# Patient Record
Sex: Female | Born: 1990
Health system: Southern US, Community
[De-identification: ages and names within clinical notes are randomized; demographics above are authoritative.]

## PROBLEM LIST (undated history)

## (undated) DIAGNOSIS — F410 Panic disorder [episodic paroxysmal anxiety] without agoraphobia: Secondary | ICD-10-CM

## (undated) DIAGNOSIS — F329 Major depressive disorder, single episode, unspecified: Secondary | ICD-10-CM

## (undated) DIAGNOSIS — I10 Essential (primary) hypertension: Secondary | ICD-10-CM

## (undated) DIAGNOSIS — H469 Unspecified optic neuritis: Secondary | ICD-10-CM

## (undated) DIAGNOSIS — G629 Polyneuropathy, unspecified: Secondary | ICD-10-CM

## (undated) DIAGNOSIS — F419 Anxiety disorder, unspecified: Secondary | ICD-10-CM

## (undated) DIAGNOSIS — F429 Obsessive-compulsive disorder, unspecified: Secondary | ICD-10-CM

## (undated) DIAGNOSIS — R51 Headache: Secondary | ICD-10-CM

## (undated) DIAGNOSIS — R519 Headache, unspecified: Secondary | ICD-10-CM

## (undated) DIAGNOSIS — M797 Fibromyalgia: Secondary | ICD-10-CM

## (undated) DIAGNOSIS — F1111 Opioid abuse, in remission: Secondary | ICD-10-CM

## (undated) DIAGNOSIS — F32A Depression, unspecified: Secondary | ICD-10-CM

## (undated) DIAGNOSIS — B977 Papillomavirus as the cause of diseases classified elsewhere: Secondary | ICD-10-CM

## (undated) HISTORY — DX: Anxiety disorder, unspecified: F41.9

## (undated) HISTORY — DX: Unspecified optic neuritis: H46.9

## (undated) HISTORY — PX: WISDOM TOOTH EXTRACTION: SHX21

## (undated) HISTORY — DX: Obsessive-compulsive disorder, unspecified: F42.9

## (undated) HISTORY — DX: Fibromyalgia: M79.7

## (undated) HISTORY — DX: Headache, unspecified: R51.9

## (undated) HISTORY — DX: Polyneuropathy, unspecified: G62.9

## (undated) HISTORY — DX: Panic disorder (episodic paroxysmal anxiety): F41.0

## (undated) HISTORY — DX: Headache: R51

## (undated) HISTORY — DX: Major depressive disorder, single episode, unspecified: F32.9

## (undated) HISTORY — DX: Essential (primary) hypertension: I10

## (undated) HISTORY — DX: Papillomavirus as the cause of diseases classified elsewhere: B97.7

## (undated) HISTORY — DX: Depression, unspecified: F32.A

---

## 2014-07-18 DIAGNOSIS — F419 Anxiety disorder, unspecified: Secondary | ICD-10-CM | POA: Insufficient documentation

## 2014-08-15 ENCOUNTER — Encounter (HOSPITAL_COMMUNITY): Payer: Self-pay | Admitting: Licensed Clinical Social Worker

## 2014-08-15 ENCOUNTER — Encounter (INDEPENDENT_AMBULATORY_CARE_PROVIDER_SITE_OTHER): Payer: Self-pay

## 2014-08-15 ENCOUNTER — Ambulatory Visit (INDEPENDENT_AMBULATORY_CARE_PROVIDER_SITE_OTHER): Payer: BLUE CROSS/BLUE SHIELD | Admitting: Licensed Clinical Social Worker

## 2014-08-15 DIAGNOSIS — F429 Obsessive-compulsive disorder, unspecified: Secondary | ICD-10-CM | POA: Insufficient documentation

## 2014-08-15 DIAGNOSIS — F42 Obsessive-compulsive disorder: Secondary | ICD-10-CM | POA: Diagnosis not present

## 2014-08-15 DIAGNOSIS — R519 Headache, unspecified: Secondary | ICD-10-CM

## 2014-08-15 DIAGNOSIS — R51 Headache: Secondary | ICD-10-CM

## 2014-08-15 NOTE — Progress Notes (Signed)
Patient:   Jaime Leblanc   DOB:   10/26/1990  MR Number:  [id number]  [facility]  [facility] [place] [address] [place] [id number] Dept: [phone]           Date of Service:   08/15/14  Start Time:   10:10am End Time:   11:10am  Provider/Observer:  Marilu FavreSarah A Gaje Tennyson Clinical Social Work       Billing Code/Service: [phone]  Comprehensive Clinical Assessment  Information for assessment provided by: patient   Chief Complaint:    Anxiety     Presenting Problem/Symptoms:  Mom was sick with stage 4 colon cancer through much of Zaiyah's childhood.  She survived.  Often thinks about how she wants to be there for her mom if anything bad were to happen.  Mom no longer has colostomy bag, but does have to use the bathroom pretty frequently.  Went to [employer] for college.  Moved back home March 2013.  Didn't like being away from family.   December her best friend's mom got sick with cancer and died.  It happened quickly.   Anxiety is more noticeable when she goes out of town.      Previous MH/SA diagnoses: none      Mental Health Symptoms:    Depression: Denies depressed mood or anhedonia   Anxiety: Marked restlessness or feeling on edge, easily fatigued, irritability, muscle tension, sleep disturbance  Panic Attacks: estimates 2 in the past two months  Self-Harm Potential: Thoughts of Self-Harm: none Method: no plan Availability of means: na Is there a family history of suicide? no Previous attempts? no Preoccupation with death? Yes, thinks about what would happen if her parents passed away History of acts of self-harm? no  Dangerousness to Others Potential: Denies Family history of violence? no Previous attempts? no    Mania/hypomania: denies    Psychosis: denies    Abuse/Trauma History: denies  PTSD symptoms: Hypervigilance, exaggerated startle response  Obsessions: recurrent &  persistent thoughts/impulses/images that cause anxiety, attempts to suppress or neutralize the thoughts, time-consuming, disrupts routine/functioning, good/poor/no insight  Intrusive thoughts about germs Intrusive thoughts about health of mom  Compulsions: repeated behaviors/mental acts, "driven" to perform behaviors/acts, intended to reduce stress or prevent a negative outcome, not connected to an actual stressor, intrusive/time consuming, disrupts routine/functioning, good/poor/no insight  For the past month she has been taking 4-5 showers a day, washing her hands repeatedly, using hand sanitizer, and brushing and flossing her teeth 5-6 times a day.     Gets anxious when her school work is not planned out perfectly.  Writes down everything even though the likelihood of her forgetting is low.            Mental Status  Interactions:    Active   Attention:   Good  Memory:   Intact  Speech:   pressured  Flow of Thought:  Normal  Thought Content:  Rumination  Orientation:   person, place and time/date  Judgment:   Good  Affect/Mood:   Anxious  Insight:   Good        Medical History:    Headaches Stomach aches  Current medications:   B12 1cc daily Celexa 10mg  daily Buspirone 10mg  Take 5mg  3 times daily as needed Zofran as needed for nausea                  Mental Health/Substance Use Treatment History:    none  Family Med/Psych History: No MH issues in family                                                Mom- colon cancer    Substance Use History:   Former smoker- 2 years Alcohol-maybe once a month   Marital Status: single  Lives with: mom, dad, brother ([phone])  Family Relationships: mom is "the most important person" in her life Good relationship with dad- similar personality Polar opposites of her brother-cares a lot about getting approval from others, studying law  My maternal grandmother watched me while my mom was  sick.  Spends time with grandparents on the weekend.   Other Social Supports: 3 close friends  Current Employment: Psychologist, occupational in the mall for the past 6 months   Enjoys it  Past Employment:  Social worker   Education:  some Automotive engineer        Currently going to [employer] to double major in Chief Financial Officer and History                                        Going to [employer] in the fall.  Legal History:  none  Religion/Spirituality:  Christian, Catholic  Not currently going to church  Hobbies:  Drawing, spend time with animals, volunteers at [employer], spend time with grandparents  Strengths/Protective Factors: a positive person, caring, never holds a grudge        Impression/DX:  F42  Obsessive Compulsive Disorder  Disposition/Plan:  Recommending individual therapy with a focus on CBT interventions for OCD.  Also recommending a psychiatric evaluation followed by medication management.

## 2014-08-30 ENCOUNTER — Emergency Department
Admission: EM | Admit: 2014-08-30 | Discharge: 2014-08-30 | Disposition: A | Discharge status: Home / Self Care | Payer: BLUE CROSS/BLUE SHIELD | Source: Home / Self Care | Attending: Family Medicine | Admitting: Family Medicine

## 2014-08-30 ENCOUNTER — Encounter: Payer: Self-pay | Admitting: Emergency Medicine

## 2014-08-30 DIAGNOSIS — R590 Localized enlarged lymph nodes: Secondary | ICD-10-CM

## 2014-08-30 LAB — POCT CBC W AUTO DIFF (K'VILLE URGENT CARE)

## 2014-08-30 NOTE — ED Notes (Signed)
Patient reports noticing a lump under right arm/axilla last night; it is tender.

## 2014-08-30 NOTE — Discharge Instructions (Signed)
May take Ibuprofen , 3 tabs every 8 hours with food as needed for pain.   Lymphadenopathy Lymphadenopathy means "disease of the lymph glands." But the term is usually used to describe swollen or enlarged lymph glands, also called lymph nodes. These are the bean-shaped organs found in many locations including the neck, underarm, and groin. Lymph glands are part of the immune system, which fights infections in your body. Lymphadenopathy can occur in just one area of the body, such as the neck, or it can be generalized, with lymph node enlargement in several areas. The nodes found in the neck are the most common sites of lymphadenopathy. CAUSES When your immune system responds to germs (such as viruses or bacteria ), infection-fighting cells and fluid build up. This causes the glands to grow in size. Usually, this is not something to worry about. Sometimes, the glands themselves can become infected and inflamed. This is called lymphadenitis. Enlarged lymph nodes can be caused by many diseases:  Bacterial disease, such as strep throat or a skin infection.  Viral disease, such as a common cold.  Other germs, such as Lyme disease, tuberculosis, or sexually transmitted diseases.  Cancers, such as lymphoma (cancer of the lymphatic system) or leukemia (cancer of the white blood cells).  Inflammatory diseases such as lupus or rheumatoid arthritis.  Reactions to medications. Many of the diseases above are rare, but important. This is why you should see your caregiver if you have lymphadenopathy. SYMPTOMS  Swollen, enlarged lumps in the neck, back of the head, or other locations.  Tenderness.  Warmth or redness of the skin over the lymph nodes.  Fever. DIAGNOSIS Enlarged lymph nodes are often near the source of infection. They can help health care providers diagnose your illness. For instance:  Swollen lymph nodes around the jaw might be caused by an infection in the mouth.  Enlarged  glands in the neck often signal a throat infection.  Lymph nodes that are swollen in more than one area often indicate an illness caused by a virus. Your caregiver will likely know what is causing your lymphadenopathy after listening to your history and examining you. Blood tests, x-rays, or other tests may be needed. If the cause of the enlarged lymph node cannot be found, and it does not go away by itself, then a biopsy may be needed. Your caregiver will discuss this with you. TREATMENT Treatment for your enlarged lymph nodes will depend on the cause. Many times the nodes will shrink to normal size by themselves, with no treatment. Antibiotics or other medicines may be needed for infection. Only take over-the-counter or prescription medicines for pain, discomfort, or fever as directed by your caregiver. HOME CARE INSTRUCTIONS Swollen lymph glands usually return to normal when the underlying medical condition goes away. If they persist, contact your health-care provider. He/she might prescribe antibiotics or other treatments, depending on the diagnosis. Take any medications exactly as prescribed. Keep any follow-up appointments made to check on the condition of your enlarged nodes. SEEK MEDICAL CARE IF:  Swelling lasts for more than two weeks.  You have symptoms such as weight loss, night sweats, fatigue, or fever that does not go away.  The lymph nodes are hard, seem fixed to the skin, or are growing rapidly.  Skin over the lymph nodes is red and inflamed. This could mean there is an infection. SEEK IMMEDIATE MEDICAL CARE IF:  Fluid starts leaking from the area of the enlarged lymph node.  You develop a fever of  102 F (38.9 C) or greater.  Severe pain develops (not necessarily at the site of a large lymph node).  You develop chest pain or shortness of breath.  You develop worsening abdominal pain. MAKE SURE YOU:  Understand these instructions.  Will watch your condition.  Will  get help right away if you are not doing well or get worse. Document Released: 02/26/2008 Document Revised: 10/03/2013 Document Reviewed: 02/26/2008 [facility] Patient Information 2015 [place], [place]. This information is not intended to replace advice given to you by your health care provider. Make sure you discuss any questions you have with your health care provider.

## 2014-08-30 NOTE — ED Provider Notes (Signed)
CSN: [id number]     Arrival date & time 08/30/14  1159 History   First MD Initiated Contact with Patient 08/30/14 1302     Chief Complaint  Patient presents with  . Mass      HPI Comments: Patient reports feeling a tender lump in her right axilla last night.  She feels well otherwise.  No fevers, chills, and sweats.  She denies animal or cat bites. She reports having a viral illness 1.5 weeks ago with fever, mouth sores, and nausea/vomiting, all resolved.  The history is provided by the patient and a parent.    Past Medical History  Diagnosis Date  . Headache   . Anxiety    Past Surgical History  Procedure Laterality Date  . Wisdom tooth extraction     Family History  Problem Relation Age of Onset  . Colon cancer Mother    History  Substance Use Topics  . Smoking status: Former Smoker    Quit date: 08/15/2010  . Smokeless tobacco: Not on file  . Alcohol Use: Yes     Comment: Maybe once a month   OB History    No data available     Review of Systems  Constitutional: Negative for fever, chills, diaphoresis, activity change, appetite change and fatigue.  HENT: Negative.   Eyes: Negative.   Respiratory: Negative.   Cardiovascular: Negative.   Gastrointestinal: Negative.   Genitourinary: Negative.   Musculoskeletal: Negative.   Skin: Negative.   Neurological: Negative.   Hematological: Positive for adenopathy.    Allergies  Azithromycin and Lamotrigine  Home Medications   Prior to Admission medications   Medication Sig Start Date End Date Taking? Authorizing Provider  citalopram (CELEXA) 10 MG tablet Take 10 mg by mouth. 06/29/14 06/29/15  Historical Provider, MD  cyanocobalamin (,VITAMIN B-12,) 1000 MCG/ML injection  04/05/14   Historical Provider, MD  diphenhydrAMINE (BENADRYL) 25 mg capsule Take 25 mg by mouth.    Historical Provider, MD  etonogestrel (NEXPLANON) 68 MG IMPL implant Inject into the skin.    Historical Provider, MD   There were no vitals taken  for this visit. Physical Exam Nursing notes and Vital Signs reviewed. Appearance:  Patient appears stated age, and in no acute distress Eyes:  Pupils are equal, round, and reactive to light and accomodation.  Extraocular movement is intact.  Conjunctivae are not inflamed  Ears:  Canals normal.  Tympanic membranes normal.  Nose:   Normal turbinates.  No sinus tenderness.   Pharynx:  Normal Neck:  Supple.   Tender shotty posterior nodes are palpated bilaterally  Right axilla:  Prominent soft tender node approximately 2.5cm by 5cm.  Two smaller nodes tender but not enlarged.  Left axilla:  Several prominent nontender nodes. Lungs:  Clear to auscultation.  Breath sounds are equal.  Heart:  Regular rate and rhythm without murmurs, rubs, or gallops.  Abdomen:  Nontender without masses or hepatosplenomegaly.  There is tenderness to palpation over spleen.  Bowel sounds are present.  No CVA or flank tenderness.  Extremities:  No edema.  No calf tenderness Skin:  No rash present.  No lesions or evidence cellulitis right upper extremity ED Course  Procedures  None    Labs Reviewed  EPSTEIN-BARR VIRUS VCA, IGG  EPSTEIN-BARR VIRUS VCA, IGM  EPSTEIN-BARR VIRUS EARLY D ANTIGEN ANTIBODY, IGG  EPSTEIN-BARR VIRUS NUCLEAR ANTIGEN ANTIBODY, IGG  POCT CBC W AUTO DIFF ([facility]):  WBC 6.9; LY 32.4; MO 4.2; GR 63.4; Hgb 13.7; Platelets  276       MDM   1. Lymphadenopathy, axillary    EBV titers pending.   Followup with Family Doctor if not improved in two weeks.    Lattie Haw, MD 09/02/14 [phone]

## 2014-08-31 LAB — EPSTEIN-BARR VIRUS NUCLEAR ANTIGEN ANTIBODY, IGG: EBV NA IgG: 219 U/mL — ABNORMAL HIGH (ref <18.0)

## 2014-08-31 LAB — EPSTEIN-BARR VIRUS EARLY D ANTIGEN ANTIBODY, IGG: EBV EA IgG: <5 U/mL (ref <9.0)

## 2014-08-31 LAB — EPSTEIN-BARR VIRUS VCA, IGM

## 2014-08-31 LAB — EPSTEIN-BARR VIRUS VCA, IGG: EBV VCA IgG: 422 U/mL — ABNORMAL HIGH (ref <18.0)

## 2014-09-04 ENCOUNTER — Telehealth: Payer: Self-pay | Admitting: Emergency Medicine

## 2014-09-04 ENCOUNTER — Ambulatory Visit (HOSPITAL_COMMUNITY): Payer: BLUE CROSS/BLUE SHIELD | Admitting: Licensed Clinical Social Worker

## 2014-09-12 ENCOUNTER — Ambulatory Visit (HOSPITAL_COMMUNITY): Payer: BLUE CROSS/BLUE SHIELD | Admitting: Psychiatry

## 2014-10-12 ENCOUNTER — Ambulatory Visit (HOSPITAL_COMMUNITY): Payer: BLUE CROSS/BLUE SHIELD | Admitting: Licensed Clinical Social Worker

## 2014-10-19 ENCOUNTER — Ambulatory Visit (HOSPITAL_COMMUNITY): Payer: BLUE CROSS/BLUE SHIELD | Admitting: Licensed Clinical Social Worker

## 2014-10-26 ENCOUNTER — Ambulatory Visit (HOSPITAL_COMMUNITY): Payer: BLUE CROSS/BLUE SHIELD | Admitting: Licensed Clinical Social Worker

## 2014-11-02 ENCOUNTER — Ambulatory Visit (HOSPITAL_COMMUNITY): Payer: BLUE CROSS/BLUE SHIELD | Admitting: Licensed Clinical Social Worker

## 2014-11-09 ENCOUNTER — Ambulatory Visit (HOSPITAL_COMMUNITY): Payer: BLUE CROSS/BLUE SHIELD | Admitting: Licensed Clinical Social Worker

## 2014-11-16 ENCOUNTER — Ambulatory Visit (HOSPITAL_COMMUNITY): Payer: BLUE CROSS/BLUE SHIELD | Admitting: Licensed Clinical Social Worker

## 2014-11-23 ENCOUNTER — Ambulatory Visit (HOSPITAL_COMMUNITY): Payer: BLUE CROSS/BLUE SHIELD | Admitting: Licensed Clinical Social Worker

## 2014-11-30 ENCOUNTER — Ambulatory Visit (HOSPITAL_COMMUNITY): Payer: BLUE CROSS/BLUE SHIELD | Admitting: Licensed Clinical Social Worker

## 2014-12-27 ENCOUNTER — Encounter: Payer: Self-pay | Admitting: *Deleted

## 2014-12-27 ENCOUNTER — Emergency Department (INDEPENDENT_AMBULATORY_CARE_PROVIDER_SITE_OTHER)
Admission: EM | Admit: 2014-12-27 | Discharge: 2014-12-27 | Disposition: A | Discharge status: Home / Self Care | Payer: BLUE CROSS/BLUE SHIELD | Source: Home / Self Care | Attending: Family Medicine | Admitting: Family Medicine

## 2014-12-27 DIAGNOSIS — B002 Herpesviral gingivostomatitis and pharyngotonsillitis: Secondary | ICD-10-CM

## 2014-12-27 DIAGNOSIS — J029 Acute pharyngitis, unspecified: Secondary | ICD-10-CM | POA: Diagnosis not present

## 2014-12-27 LAB — POCT URINE PREGNANCY: PREG TEST UR: NEGATIVE

## 2014-12-27 LAB — POCT RAPID STREP A (OFFICE): Rapid Strep A Screen: NEGATIVE

## 2014-12-27 MED ORDER — VALACYCLOVIR HCL 1 G PO TABS: ORAL_TABLET | ORAL | Status: DC

## 2014-12-27 NOTE — ED Notes (Signed)
Pt c/o sore on her RT side bottom lip x 1 day with chills and increased heart rate. She also request a pregnancy test.

## 2014-12-27 NOTE — ED Provider Notes (Signed)
CSN: [id number]     Arrival date & time 12/27/14  1317 History   First MD Initiated Contact with Patient 12/27/14 1335     Chief Complaint  Patient presents with  . Oral Swelling      HPI Comments: Patient complains of onset of sore swollen area on right lower lip yesterday.  She has had cold sores in the past but this lesion seems different.  She has had chills/sweats, nausea, myalgias, and mild headache.  She developed a sore throat last night, and soreness in her anterior chest.  She has been fatigued for about 1.5 weeks. She requests a pregnancy test also.  The history is provided by the patient.    Past Medical History  Diagnosis Date  . Headache   . Anxiety    Past Surgical History  Procedure Laterality Date  . Wisdom tooth extraction     Family History  Problem Relation Age of Onset  . Colon cancer Mother   . Cancer Mother   . Hypertension Mother   . Hypertension Father    History  Substance Use Topics  . Smoking status: Former Smoker    Quit date: 08/15/2010  . Smokeless tobacco: Not on file  . Alcohol Use: Yes     Comment: Maybe once a month   OB History    No data available     Review of Systems + sore throat No cough No pleuritic pain No wheezing + nasal congestion ? post-nasal drainage No sinus pain/pressure No itchy/red eyes No earache No hemoptysis No SOB No fever, + chills + nausea No vomiting + abdominal pain + diarrhea No urinary symptoms No skin rash + fatigue No myalgias + headache    Allergies  Azithromycin and Lamotrigine  Home Medications   Prior to Admission medications   Medication Sig Start Date End Date Taking? Authorizing Provider  DULoxetine (CYMBALTA) 30 MG capsule Take 30 mg by mouth daily.   Yes Historical Provider, MD  citalopram (CELEXA) 10 MG tablet Take 10 mg by mouth. 06/29/14 06/29/15  Historical Provider, MD  cyanocobalamin (,VITAMIN B-12,) 1000 MCG/ML injection  04/05/14   Historical Provider, MD    diphenhydrAMINE (BENADRYL) 25 mg capsule Take 25 mg by mouth.    Historical Provider, MD  etonogestrel (NEXPLANON) 68 MG IMPL implant Inject into the skin.    Historical Provider, MD  valACYclovir (VALTREX) 1000 MG tablet Take two tabs by mouth every 12 hours for one day.  Take at onset of cold sore 12/27/14   Lattie Haw, MD   BP 129/86 mmHg  Pulse 117  Temp(Src) 99.2 F (37.3 C) (Oral)  Resp 16  Ht  (1.651 m)  Wt 127 lb (57.607 kg)  BMI 21.13 kg/m2  SpO2 98% Physical Exam  Constitutional: She is oriented to person, place, and time. She appears well-developed and well-nourished. No distress.  HENT:  Head: Normocephalic.  Right Ear: Tympanic membrane and external ear normal.  Left Ear: Tympanic membrane and external ear normal.  Mouth/Throat: Oropharynx is clear and moist and mucous membranes are normal.    Right lower external lip has a vesicular eruption with tenderness to palpation and mild swelling as noted on diagram.    Eyes: Conjunctivae and EOM are normal. Pupils are equal, round, and reactive to light. Right eye exhibits no discharge. Left eye exhibits no discharge.  Neck: Neck supple.  Tender shotty anterior and posterior nodes are palpated.  Cardiovascular: Normal heart sounds.   Pulmonary/Chest: Breath sounds  normal.  Abdominal: There is no tenderness.  Lymphadenopathy:    She has cervical adenopathy.  Neurological: She is alert and oriented to person, place, and time.  Skin: Skin is warm and dry.  Nursing note and vitals reviewed.   ED Course  Procedures  None    Labs Reviewed  POCT RAPID STREP A (OFFICE) negative  POCT URINE PREGNANCY negative      MDM   1. Herpes stomatitis; suspect early viral URI    Begin Valtrex If cold-like symptoms develop, try the following:  Take plain guaifenesin (1200mg  extended release tabs such as Mucinex) twice daily, with plenty of water, for cough and congestion.  May add Pseudoephedrine (30mg , one or two  every 4 to 6 hours) for sinus congestion.  Get adequate rest.   May use Afrin nasal spray (or generic oxymetazoline) twice daily for about 5 days.  Also recommend using saline nasal spray several times daily and saline nasal irrigation (AYR is a common brand).  Use Flonase nasal spray each morning after using Afrin nasal spray and saline nasal irrigation. Try warm salt water gargles for sore throat.  Stop all antihistamines for now, and other non-prescription cough/cold preparations. May take Ibuprofen 200mg , 4 tabs every 8 hours with food for body aches, fever, etc. May take Delsym Cough Suppressant at bedtime for nighttime cough.    Follow-up with family doctor if not improving about 7 to10 days.     Lattie Haw, MD 12/31/14 1003

## 2014-12-27 NOTE — Discharge Instructions (Signed)
If cold-like symptoms develop, try the following:  Take plain guaifenesin (  extended release tabs such as Mucinex) twice daily, with plenty of water, for cough and congestion.  May add Pseudoephedrine ( , one or two every 4 to 6 hours) for sinus congestion.  Get adequate rest.   May use Afrin nasal spray (or generic oxymetazoline) twice daily for about 5 days.  Also recommend using saline nasal spray several times daily and saline nasal irrigation (AYR is a common brand).  Use Flonase nasal spray each morning after using Afrin nasal spray and saline nasal irrigation. Try warm salt water gargles for sore throat.  Stop all antihistamines for now, and other non-prescription cough/cold preparations. May take Ibuprofen , 4 tabs every 8 hours with food for body aches, fever, etc. May take Delsym Cough Suppressant at bedtime for nighttime cough.    Follow-up with family doctor if not improving about 7 to10 days.      Primary Herpetic Gingivostomatitis  Primary herpetic gingivostomatitis is an infection of the mouth, gums, and throat. It is a common infection in children, teenagers, and young adults. CAUSES  Primary herpetic gingivostomatitis is caused by a virus called herpes simplex type 1 (HSV). This is the same virus that causes cold sores. This virus is carried by many people. Most people get this infection early in childhood. Once infected, people carry the virus forever. It may flare up as cold sores repeatedly. The first infection of this virus may go unnoticed. When it causes symptoms of sore mouth and gums, it is called gingivostomatitis. SYMPTOMS  The symptoms of this infection can be mild or severe. Symptoms may last for 1 to 2 weeks and may include:  Small sores and blisters in the mouth, tongue, gums, throat, and on the lips.  Swelling of the gums.  Severe mouth pain.  Bleeding gums.  Irritability from pain.  Decreased appetite or refusal to eat or  drink.  Drooling.  Bad breath.  High fever.  Swollen tender lymph nodes on the sides of the neck.  Headache.  General discomfort, uneasiness, or ill feeling. DIAGNOSIS  Diagnosis of gingivostomatitis is usually made by a physical exam. Sometimes the sores are tested for the HSV virus. TREATMENT  This infection goes away on its own. Sometimes, a medicine to treat the herpes virus is used to help shorten the illness. Medicated mouth rinses can help with mouth pain. HOME CARE INSTRUCTIONS  Only take over-the-counter or prescription medicines for pain, discomfort, or fever as directed by your caregiver.  Keep the mouth and teeth clean. Use gentle brushing. If brushing is too painful, wipe the teeth with a wet washcloth. Bleeding of the gums may occur.  Infants should continue with breast milk or formula as normal.  Offer soft and cold foods to toddlers and children. Ice cream, gelatin dessert, and yogurt work well.  Offer plenty of liquids to prevent dehydration. Frozen ice pops and cool, non-citrus juices may be soothing.  Keep your child away from others, especially infants and patients on cancer medicines.  Wash your hands well after handling children that are infected.  Infected children should keep their hands away from their mouth. They should avoid rubbing their eyes, and they should wash their hands often. SEEK MEDICAL CARE IF:   Your child is refusing to drink or take fluids.  Your child's fever comes back after being gone for 1 or 2 days.  Your child's pain is severe and is not controlled with medicines.  Your child's  condition is getting worse. SEEK IMMEDIATE MEDICAL CARE IF:   Your child has pain and redness in the eye.  Your child has decreased or blurred vision.  Your child has eye pain or increased sensitivity to light.  Your child has tearing or fluid draining from the eye.  Your child has signs of dehydration such as unusual fussiness, weakness,  fatigue, dry mouth, no tears when crying, or not urinating at least once every 8 hours. MAKE SURE YOU:  Understand these instructions.  Will watch your child's condition.  Will get help right away if your child is not doing well or gets worse. Document Released: 08/26/2007 Document Revised: 08/11/2011 Document Reviewed: 12/09/2010 [facility] Patient Information 2015 [place], [place]. This information is not intended to replace advice given to you by your health care provider. Make sure you discuss any questions you have with your health care provider.

## 2014-12-28 LAB — STREP A DNA PROBE: GASP: NEGATIVE

## 2015-02-14 DIAGNOSIS — M797 Fibromyalgia: Secondary | ICD-10-CM | POA: Insufficient documentation

## 2015-07-06 ENCOUNTER — Telehealth: Payer: Self-pay | Admitting: Family Medicine

## 2015-07-06 NOTE — Telephone Encounter (Signed)
Patient scheduled for June 2nd at 3:3pm

## 2015-07-06 NOTE — Telephone Encounter (Addendum)
Please advise      KP 

## 2015-07-06 NOTE — Telephone Encounter (Signed)
Ok to establish 

## 2015-07-06 NOTE — Telephone Encounter (Signed)
Caller Name: Lienhard,Tina Relation to [id number]  Call back [phone]   Reason for call:  Patient would like to establish care with you she was referred by [place],Tammy L. Please advise

## 2015-11-02 ENCOUNTER — Ambulatory Visit: Payer: Self-pay | Admitting: Family Medicine

## 2015-12-19 ENCOUNTER — Telehealth: Payer: Self-pay | Admitting: Family Medicine

## 2015-12-19 NOTE — Telephone Encounter (Signed)
Attempted to contact patient to reschedule appointment with Dr. Zola ButtonLowne-Chase on August 31. Left message for patient to call and reschedule appointment. Appointment cancelled

## 2016-01-07 ENCOUNTER — Telehealth: Payer: Self-pay | Admitting: Behavioral Health

## 2016-01-07 ENCOUNTER — Encounter: Payer: Self-pay | Admitting: Behavioral Health

## 2016-01-07 ENCOUNTER — Encounter: Payer: Self-pay | Admitting: Family Medicine

## 2016-01-07 NOTE — Telephone Encounter (Signed)
Opened in error

## 2016-01-07 NOTE — Telephone Encounter (Signed)
Pre-Visit Call completed with patient and chart updated.   Pre-Visit Info documented in Specialty Comments under [employer].    

## 2016-01-08 ENCOUNTER — Ambulatory Visit (INDEPENDENT_AMBULATORY_CARE_PROVIDER_SITE_OTHER): Payer: BLUE CROSS/BLUE SHIELD | Admitting: Family Medicine

## 2016-01-08 ENCOUNTER — Other Ambulatory Visit: Payer: Self-pay

## 2016-01-08 ENCOUNTER — Encounter: Payer: Self-pay | Admitting: Family Medicine

## 2016-01-08 VITALS — BP 98/70 | HR 83 | Temp 98.2°F | Ht 64.75 in | Wt 135.0 lb

## 2016-01-08 DIAGNOSIS — G629 Polyneuropathy, unspecified: Secondary | ICD-10-CM | POA: Diagnosis not present

## 2016-01-08 DIAGNOSIS — G43001 Migraine without aura, not intractable, with status migrainosus: Secondary | ICD-10-CM | POA: Diagnosis not present

## 2016-01-08 DIAGNOSIS — M797 Fibromyalgia: Secondary | ICD-10-CM

## 2016-01-08 DIAGNOSIS — B370 Candidal stomatitis: Secondary | ICD-10-CM | POA: Diagnosis not present

## 2016-01-08 MED ORDER — ELETRIPTAN HYDROBROMIDE 20 MG PO TABS: 20.0000 mg | ORAL_TABLET | ORAL | 0 refills | Status: DC | PRN

## 2016-01-08 MED ORDER — KETOROLAC TROMETHAMINE 60 MG/2ML IM SOLN
60.0000 mg | Freq: Once | INTRAMUSCULAR | Status: AC
  Administered 2016-01-08: 60 mg via INTRAMUSCULAR

## 2016-01-08 NOTE — Patient Instructions (Signed)

## 2016-01-08 NOTE — Progress Notes (Unsigned)
Pre visit review using our clinic review tool, if applicable. No additional management support is needed unless otherwise documented below in the visit note. 

## 2016-01-08 NOTE — Progress Notes (Signed)
Patient ID: Jaime Leblanc, female    DOB: 10/16/1990  Age: 25 y.o. MRN: [id number]    Subjective:  Subjective  HPI Jaime Leblanc presents for mult complaints and to establish.  Pt has had several episodes of thrush, viruses, rashes,  Foot and mouth.  She has been tested for lyme, hiv and has seen several specialists-- including neuro, rheum and ID .     Review of Systems  Constitutional: Positive for fatigue. Negative for activity change, appetite change and unexpected weight change.  HENT: Positive for mouth sores.   Respiratory: Negative for cough and shortness of breath.   Cardiovascular: Negative for chest pain and palpitations.  Musculoskeletal: Positive for arthralgias, back pain, myalgias and neck pain.  Neurological: Positive for seizures and headaches.  Psychiatric/Behavioral: Positive for dysphoric mood. Negative for behavioral problems. The patient is not nervous/anxious.     History Past Medical History:  Diagnosis Date  . Anxiety   . Depression   . Fibromyalgia   . Headache   . HPV in female   . Neuropathy (HCC)    Left hand and in muscle all over the body  . OCD (obsessive compulsive disorder)   . Optic neuritis    Left Eye  . Panic disorder     She has a past surgical history that includes Wisdom tooth extraction.   Her family history includes Anxiety disorder in her mother; Colon cancer in her mother; Depression in her mother; Gout in her father; Hypertension in her father and mother.She reports that she quit smoking about 5 years ago. She does not have any smokeless tobacco history on file. She reports that she drinks alcohol. She reports that she does not use drugs.  Current Outpatient Prescriptions on File Prior to Visit  Medication Sig Dispense Refill  . ALPRAZolam (XANAX) 1 MG tablet Take 1 mg by mouth 3 (three) times daily.    . cyanocobalamin (,VITAMIN B-12,) 1000 MCG/ML injection     . diphenhydrAMINE (BENADRYL) 25 mg capsule Take 25 mg by mouth as  needed.     . etonogestrel (NEXPLANON) 68 MG IMPL implant Inject into the skin.    Marland Kitchen oxyCODONE-acetaminophen (PERCOCET) 7.5-325 MG tablet Take 1 tablet by mouth every 6 (six) hours as needed for severe pain.    Marland Kitchen sertraline (ZOLOFT) 100 MG tablet Take 200 mg by mouth daily.    . valACYclovir (VALTREX) 1000 MG tablet Take two tabs by mouth every 12 hours for one day.  Take at onset of cold sore (Patient not taking: Reported on 01/07/2016) 4 tablet 3   No current facility-administered medications on file prior to visit.      Objective:  Objective  Physical Exam  Constitutional: She is oriented to person, place, and time. She appears well-developed and well-nourished.  HENT:  Head: Normocephalic and atraumatic.  Eyes: Conjunctivae and EOM are normal.  Neck: Normal range of motion. Neck supple. No JVD present. Carotid bruit is not present. No thyromegaly present.  Cardiovascular: Normal rate, regular rhythm and normal heart sounds.   No murmur heard. Pulmonary/Chest: Effort normal and breath sounds normal. No respiratory distress. She has no wheezes. She has no rales. She exhibits no tenderness.  Musculoskeletal: She exhibits no edema.  Neurological: She is alert and oriented to person, place, and time.  Psychiatric: She has a normal mood and affect. Her behavior is normal. Judgment and thought content normal.  Nursing note and vitals reviewed.  BP 98/70 (BP Location: Right Arm, Patient Position: Sitting, Cuff  Size: Small)   Pulse 83   Temp 98.2 F (36.8 C) (Oral)   Ht 5' 4.75" (1.645 m)   Wt 135 lb (61.2 kg)   LMP 01/08/2016 (Exact Date)   SpO2 98%   BMI 22.64 kg/m  Wt Readings from Last 3 Encounters:  01/08/16 135 lb (61.2 kg)  12/27/14 127 lb (57.6 kg)  08/30/14 124 lb (56.2 kg)     No results found for: WBC, HGB, HCT, PLT, GLUCOSE, CHOL, TRIG, HDL, LDLDIRECT, LDLCALC, ALT, AST, NA, K, CL, CREATININE, BUN, CO2, TSH, PSA, INR, GLUF, HGBA1C, MICROALBUR  No results found.     Assessment & Plan:  Plan  I am having Jaime Leblanc start on eletriptan. I am also having her maintain her cyanocobalamin, diphenhydrAMINE, etonogestrel, valACYclovir, oxyCODONE-acetaminophen, ALPRAZolam, sertraline, ketoconazole, and traZODone. We administered ketorolac.  Meds ordered this encounter  Medications  . eletriptan (RELPAX) 20 MG tablet    Sig: Take 1 tablet (20 mg total) by mouth as needed for migraine or headache. May repeat in 2 hours if headache persists or recurs.    Dispense:  10 tablet    Refill:  0  . ketorolac (TORADOL) injection 60 mg    Problem List Items Addressed This Visit      Unprioritized   Fibromyalgia    Per rheumatology--- switching to [facility] Takes pain meds and is also going into pain management      Relevant Medications   ketorolac (TORADOL) injection 60 mg (Completed)   Neuropathy (HCC)    Unknown etiology Being worked up by [facility]  [facility] pending       Thrush, oral    Recurrent [facility] magic mouthwash and diflucan not effective        Other Visit Diagnoses    Migraine without aura and with status migrainosus, not intractable    -  Primary   Relevant Medications   eletriptan (RELPAX) 20 MG tablet   ketorolac (TORADOL) injection 60 mg (Completed)    medical records from [facility] reviewed in care every where.   Follow-up: Return if symptoms worsen or fail to improve, for annual exam, fasting.  Donato SchultzYvonne R Lowne Chase, DO

## 2016-01-08 NOTE — Progress Notes (Signed)
Pre visit review using our clinic review tool, if applicable. No additional management support is needed unless otherwise documented below in the visit note. 

## 2016-01-09 ENCOUNTER — Encounter: Payer: Self-pay | Admitting: Family Medicine

## 2016-01-09 DIAGNOSIS — G629 Polyneuropathy, unspecified: Secondary | ICD-10-CM | POA: Insufficient documentation

## 2016-01-09 DIAGNOSIS — B370 Candidal stomatitis: Secondary | ICD-10-CM | POA: Insufficient documentation

## 2016-01-09 NOTE — Assessment & Plan Note (Signed)
Unknown etiology [facility]  [facility] pending

## 2016-01-09 NOTE — Assessment & Plan Note (Signed)
Per rheumatology--- switching to [facility] Takes pain meds and is also going into pain management

## 2016-01-09 NOTE — Assessment & Plan Note (Signed)
Recurrent [facility] magic mouthwash and diflucan not effective

## 2016-01-16 ENCOUNTER — Telehealth: Payer: Self-pay | Admitting: Family Medicine

## 2016-01-16 NOTE — Telephone Encounter (Signed)
°  Relationship to patient: Self Can be reached: [phone]   Reason for call: Request refill on oxyCODONE-acetaminophen (PERCOCET) 7.5-325 MG tablet [[id number]][[id number]]

## 2016-01-17 MED ORDER — OXYCODONE-ACETAMINOPHEN 7.5-325 MG PO TABS: 1.0000 | ORAL_TABLET | Freq: Four times a day (QID) | ORAL | 0 refills | Status: DC | PRN

## 2016-01-17 NOTE — Telephone Encounter (Signed)
Please advise on quantity.     KP 

## 2016-01-17 NOTE — Telephone Encounter (Signed)
Has pt gotten her pain management appointment yet? Refill x1-- we will need contract and uds --- [facility]

## 2016-01-17 NOTE — Telephone Encounter (Signed)
Last seen 01/08/16 and Rx was not filled here.   Please advise     KP

## 2016-01-17 NOTE — Telephone Encounter (Signed)
90

## 2016-01-17 NOTE — Telephone Encounter (Signed)
Patient aware RX is ready for pick up.    KP

## 2016-01-18 MED FILL — OXYCODONE-APAP 7.5-325 MG: 7.5-325 | 22 days supply | Qty: 90 | Fill #0

## 2016-01-31 ENCOUNTER — Ambulatory Visit: Payer: Self-pay | Admitting: Family Medicine

## 2016-02-08 ENCOUNTER — Other Ambulatory Visit: Payer: Self-pay | Admitting: Family Medicine

## 2016-02-08 DIAGNOSIS — G43001 Migraine without aura, not intractable, with status migrainosus: Secondary | ICD-10-CM

## 2016-02-26 ENCOUNTER — Ambulatory Visit: Payer: Self-pay | Admitting: Medical

## 2016-02-28 ENCOUNTER — Encounter: Payer: Self-pay | Admitting: Family Medicine

## 2016-02-28 ENCOUNTER — Other Ambulatory Visit: Payer: Self-pay

## 2016-02-28 ENCOUNTER — Ambulatory Visit (INDEPENDENT_AMBULATORY_CARE_PROVIDER_SITE_OTHER): Payer: BLUE CROSS/BLUE SHIELD | Admitting: Family Medicine

## 2016-02-28 VITALS — BP 118/85 | HR 93 | Temp 97.8°F | Wt 134.6 lb

## 2016-02-28 DIAGNOSIS — L299 Pruritus, unspecified: Secondary | ICD-10-CM

## 2016-02-28 DIAGNOSIS — R110 Nausea: Secondary | ICD-10-CM

## 2016-02-28 DIAGNOSIS — Z8669 Personal history of other diseases of the nervous system and sense organs: Secondary | ICD-10-CM | POA: Diagnosis not present

## 2016-02-28 DIAGNOSIS — R5382 Chronic fatigue, unspecified: Secondary | ICD-10-CM | POA: Diagnosis not present

## 2016-02-28 DIAGNOSIS — L989 Disorder of the skin and subcutaneous tissue, unspecified: Secondary | ICD-10-CM

## 2016-02-28 DIAGNOSIS — Z800 Family history of malignant neoplasm of digestive organs: Secondary | ICD-10-CM

## 2016-02-28 DIAGNOSIS — Z23 Encounter for immunization: Secondary | ICD-10-CM | POA: Diagnosis not present

## 2016-02-28 DIAGNOSIS — Z8639 Personal history of other endocrine, nutritional and metabolic disease: Secondary | ICD-10-CM | POA: Diagnosis not present

## 2016-02-28 LAB — CBC WITH DIFFERENTIAL/PLATELET
Basophils Absolute: 0 10*3/uL (ref 0.0–0.1)
Basophils Relative: 0.3 % (ref 0.0–3.0)
Eosinophils Absolute: 0 10*3/uL (ref 0.0–0.7)
Eosinophils Relative: 0.3 % (ref 0.0–5.0)
HEMATOCRIT: 40 % (ref 36.0–46.0)
HEMOGLOBIN: 13.6 g/dL (ref 12.0–15.0)
LYMPHS PCT: 16.5 % (ref 12.0–46.0)
Lymphs Abs: 1.5 10*3/uL (ref 0.7–4.0)
MCHC: 34 g/dL (ref 30.0–36.0)
MCV: 86.5 fl (ref 78.0–100.0)
MONOS PCT: 6.5 % (ref 3.0–12.0)
Monocytes Absolute: 0.6 10*3/uL (ref 0.1–1.0)
Neutro Abs: 6.8 10*3/uL (ref 1.4–7.7)
Neutrophils Relative %: 76.4 % (ref 43.0–77.0)
Platelets: 313 10*3/uL (ref 150.0–400.0)
RBC: 4.63 Mil/uL (ref 3.87–5.11)
RDW: 13.8 % (ref 11.5–15.5)
WBC: 8.9 10*3/uL (ref 4.0–10.5)

## 2016-02-28 LAB — HEMOGLOBIN A1C: Hgb A1c MFr Bld: 5.1 % (ref 4.6–6.5)

## 2016-02-28 LAB — COMPREHENSIVE METABOLIC PANEL
ALBUMIN: 4.4 g/dL (ref 3.5–5.2)
ALK PHOS: 68 U/L (ref 39–117)
ALT: 19 U/L (ref 0–35)
AST: 15 U/L (ref 0–37)
BUN: 8 mg/dL (ref 6–23)
CO2: 25 mEq/L (ref 19–32)
Calcium: 9.4 mg/dL (ref 8.4–10.5)
Chloride: 106 mEq/L (ref 96–112)
Creatinine, Ser: 0.62 mg/dL (ref 0.40–1.20)
GFR: 125.63 mL/min (ref >60.00)
Glucose, Bld: 76 mg/dL (ref 70–99)
POTASSIUM: 3.3 meq/L — AB (ref 3.5–5.1)
SODIUM: 140 meq/L (ref 135–145)
TOTAL PROTEIN: 7.6 g/dL (ref 6.0–8.3)
Total Bilirubin: 0.6 mg/dL (ref 0.2–1.2)

## 2016-02-28 LAB — TSH: TSH: 0.39 u[IU]/mL (ref 0.35–4.50)

## 2016-02-28 MED ORDER — HYDROXYZINE HCL 25 MG PO TABS: 25.0000 mg | ORAL_TABLET | Freq: Three times a day (TID) | ORAL | 0 refills | Status: DC | PRN

## 2016-02-28 MED ORDER — PROMETHAZINE HCL 25 MG PO TABS: 25.0000 mg | ORAL_TABLET | Freq: Three times a day (TID) | ORAL | 0 refills | Status: DC | PRN

## 2016-02-28 MED ORDER — ELETRIPTAN HYDROBROMIDE 40 MG PO TABS: 40.0000 mg | ORAL_TABLET | ORAL | 0 refills | Status: DC | PRN

## 2016-02-28 MED FILL — hydrOXYzine HCL 25 MG TABS: 25 | 10 days supply | Qty: 30 | Fill #0

## 2016-02-28 MED FILL — ELETRIPTAN HBR 40 MG TABLET: 40 | 30 days supply | Qty: 10 | Fill #0

## 2016-02-28 MED FILL — PROMETHAZINE 25 MG TABLET: 25 | 7 days supply | Qty: 20 | Fill #0

## 2016-02-28 NOTE — Progress Notes (Signed)
Pre visit review using our clinic review tool, if applicable. No additional management support is needed unless otherwise documented below in the visit note. 

## 2016-02-28 NOTE — Progress Notes (Signed)
Patient ID: Jaime Leblanc, female    DOB: 1990/10/12  Age: 25 y.o. MRN: [id number]    Subjective:  Subjective  HPI Jaime Leblanc presents for f/u migraines -- the relpax is working but not long enough She has appt with neuro, rheum coming up.   She still c/o nausea and occasional vomiting, fatigue She is seeing pain management as well .   Review of Systems  Constitutional: Positive for fatigue. Negative for appetite change, diaphoresis and unexpected weight change.  Eyes: Negative for pain, redness and visual disturbance.  Respiratory: Negative for cough, chest tightness, shortness of breath and wheezing.   Cardiovascular: Negative for chest pain, palpitations and leg swelling.  Endocrine: Negative for cold intolerance, heat intolerance, polydipsia, polyphagia and polyuria.  Genitourinary: Negative for difficulty urinating, dysuria and frequency.  Neurological: Positive for headaches. Negative for dizziness, light-headedness and numbness.  Psychiatric/Behavioral: Positive for dysphoric mood and sleep disturbance. The patient is nervous/anxious.     History Past Medical History:  Diagnosis Date  . Anxiety   . Depression   . Fibromyalgia   . Headache   . HPV in female   . Neuropathy (HCC)    Left hand and in muscle all over the body  . OCD (obsessive compulsive disorder)   . Optic neuritis    Left Eye  . Panic disorder     She has a past surgical history that includes Wisdom tooth extraction.   Her family history includes Anxiety disorder in her mother; Colon cancer in her mother; Depression in her mother; Gout in her father; Hypertension in her father and mother.She reports that she quit smoking about 5 years ago. She does not have any smokeless tobacco history on file. She reports that she drinks alcohol. She reports that she does not use drugs.  Current Outpatient Prescriptions on File Prior to Visit  Medication Sig Dispense Refill  . ALPRAZolam (XANAX) 1 MG tablet Take 1  mg by mouth 3 (three) times daily.    . cyanocobalamin (,VITAMIN B-12,) 1000 MCG/ML injection     . diphenhydrAMINE (BENADRYL) 25 mg capsule Take 25 mg by mouth as needed.     . etonogestrel (NEXPLANON) 68 MG IMPL implant Inject into the skin.    Marland Kitchen ketoconazole (NIZORAL) 200 MG tablet Take 1 tablet by mouth daily. For 2 days (Only takes in Feb)    . oxyCODONE-acetaminophen (PERCOCET) 7.5-325 MG tablet Take 1 tablet by mouth every 6 (six) hours as needed for severe pain. 90 tablet 0  . sertraline (ZOLOFT) 100 MG tablet Take 200 mg by mouth daily.    . traZODone (DESYREL) 50 MG tablet Take 1-2 mg by mouth at bedtime.  2  . valACYclovir (VALTREX) 1000 MG tablet Take two tabs by mouth every 12 hours for one day.  Take at onset of cold sore (Patient not taking: Reported on 01/07/2016) 4 tablet 3   No current facility-administered medications on file prior to visit.      Objective:  Objective  Physical Exam  Constitutional: She is oriented to person, place, and time. She appears well-developed and well-nourished.  HENT:  Head: Normocephalic and atraumatic.  Eyes: Conjunctivae and EOM are normal.  Neck: Normal range of motion. Neck supple. No JVD present. Carotid bruit is not present. No thyromegaly present.  Cardiovascular: Normal rate, regular rhythm and normal heart sounds.   No murmur heard. Pulmonary/Chest: Effort normal and breath sounds normal. No respiratory distress. She has no wheezes. She has no rales. She exhibits no  tenderness.  Musculoskeletal: She exhibits no edema.  Neurological: She is alert and oriented to person, place, and time.  Skin:     Psychiatric: She has a normal mood and affect. Her behavior is normal. Judgment and thought content normal.  Nursing note and vitals reviewed.  BP 118/85 (BP Location: Right Arm, Patient Position: Sitting, Cuff Size: Small)   Pulse 93   Temp 97.8 F (36.6 C) (Oral)   Wt 134 lb 9.6 oz (61.1 kg)   SpO2 99%   BMI 22.57 kg/m  Wt  Readings from Last 3 Encounters:  02/28/16 134 lb 9.6 oz (61.1 kg)  01/08/16 135 lb (61.2 kg)  12/27/14 127 lb (57.6 kg)     Lab Results  Component Value Date   WBC 8.9 02/28/2016   HGB 13.6 02/28/2016   HCT 40.0 02/28/2016   PLT 313.0 02/28/2016   GLUCOSE 76 02/28/2016   ALT 19 02/28/2016   AST 15 02/28/2016   NA 140 02/28/2016   K 3.3 (L) 02/28/2016   CL 106 02/28/2016   CREATININE 0.62 02/28/2016   BUN 8 02/28/2016   CO2 25 02/28/2016   TSH 0.39 02/28/2016   HGBA1C 5.1 02/28/2016    No results found.   Assessment & Plan:  Plan  I have discontinued Ms. Pintor's eletriptan. I am also having her start on eletriptan, hydrOXYzine, and promethazine. Additionally, I am having her maintain her cyanocobalamin, diphenhydrAMINE, etonogestrel, valACYclovir, ALPRAZolam, sertraline, ketoconazole, traZODone, oxyCODONE-acetaminophen, and ADDERALL XR.  Meds ordered this encounter  Medications  . eletriptan (RELPAX) 40 MG tablet    Sig: Take 1 tablet (40 mg total) by mouth as needed for migraine or headache. May repeat in 2 hours if headache persists or recurs.    Dispense:  10 tablet    Refill:  0  . hydrOXYzine (ATARAX/VISTARIL) 25 MG tablet    Sig: Take 1 tablet (25 mg total) by mouth 3 (three) times daily as needed.    Dispense:  30 tablet    Refill:  0  . promethazine (PHENERGAN) 25 MG tablet    Sig: Take 1 tablet (25 mg total) by mouth every 8 (eight) hours as needed for nausea or vomiting.    Dispense:  20 tablet    Refill:  0    Problem List Items Addressed This Visit    None    Visit Diagnoses    Skin lesion of back    -  Primary   Relevant Orders   Ambulatory referral to Dermatology   Hx of migraines       Relevant Medications   eletriptan (RELPAX) 40 MG tablet   Other Relevant Orders   CBC with Differential/Platelet (Completed)   Comprehensive metabolic panel (Completed)   POCT urinalysis dipstick   TSH (Completed)   Chronic fatigue       Relevant Orders     CBC with Differential/Platelet (Completed)   Comprehensive metabolic panel (Completed)   POCT urinalysis dipstick   TSH (Completed)   History of hyperglycemia       Relevant Orders   Hemoglobin A1c (Completed)   Family history of colon cancer in mother       Relevant Orders   Ambulatory referral to Gastroenterology   Nausea without vomiting       Relevant Medications   promethazine (PHENERGAN) 25 MG tablet   Other Relevant Orders   Ambulatory referral to Gastroenterology   Itching       Relevant Medications   hydrOXYzine (ATARAX/VISTARIL) 25  MG tablet   Encounter for immunization       Relevant Orders   Flu Vaccine QUAD 36+ mos IM (Completed)      Follow-up: No Follow-up on file.  Donato SchultzYvonne R Lowne Chase, DO

## 2016-02-28 NOTE — Patient Instructions (Signed)
Fatigue  Fatigue is feeling tired all of the time, a lack of energy, or a lack of motivation. Occasional or mild fatigue is often a normal response to activity or life in general. However, long-lasting (chronic) or extreme fatigue may indicate an underlying medical condition.  HOME CARE INSTRUCTIONS   Watch your fatigue for any changes. The following actions may help to lessen any discomfort you are feeling:  · Talk to your health care provider about how much sleep you need each night. Try to get the required amount every night.  · Take medicines only as directed by your health care provider.  · Eat a healthy and nutritious diet. Ask your health care provider if you need help changing your diet.  · Drink enough fluid to keep your urine clear or pale yellow.  · Practice ways of relaxing, such as yoga, meditation, massage therapy, or acupuncture.  · Exercise regularly.    · Change situations that cause you stress. Try to keep your work and personal routine reasonable.  · Do not abuse illegal drugs.  · Limit alcohol intake to no more than 1 drink per day for nonpregnant women and 2 drinks per day for men. One drink equals 12 ounces of beer, 5 ounces of wine, or 1½ ounces of hard liquor.  · Take a multivitamin, if directed by your health care provider.  SEEK MEDICAL CARE IF:   · Your fatigue does not get better.  · You have a fever.    · You have unintentional weight loss or gain.  · You have headaches.    · You have difficulty:      Falling asleep.    Sleeping throughout the night.  · You feel angry, guilty, anxious, or sad.     · You are unable to have a bowel movement (constipation).    · You skin is dry.     · Your legs or another part of your body is swollen.    SEEK IMMEDIATE MEDICAL CARE IF:   · You feel confused.    · Your vision is blurry.  · You feel faint or pass out.    · You have a severe headache.    · You have severe abdominal, pelvic, or back pain.    · You have chest pain, shortness of breath, or an  irregular or fast heartbeat.    · You are unable to urinate or you urinate less than normal.    · You develop abnormal bleeding, such as bleeding from the rectum, vagina, nose, lungs, or nipples.  · You vomit blood.     · You have thoughts about harming yourself or committing suicide.    · You are worried that you might harm someone else.       This information is not intended to replace advice given to you by your health care provider. Make sure you discuss any questions you have with your health care provider.     Document Released: 03/16/2007 Document Revised: 06/09/2014 Document Reviewed: 09/20/2013  Elsevier Interactive Patient Education ©2016 [employer]

## 2016-02-29 ENCOUNTER — Encounter: Payer: Self-pay | Admitting: Family Medicine

## 2016-03-06 ENCOUNTER — Ambulatory Visit: Payer: Self-pay | Admitting: Family Medicine

## 2016-03-10 ENCOUNTER — Ambulatory Visit: Payer: Self-pay | Admitting: Family Medicine

## 2016-03-24 ENCOUNTER — Encounter: Payer: Self-pay | Admitting: Family Medicine

## 2016-03-24 DIAGNOSIS — Z0289 Encounter for other administrative examinations: Secondary | ICD-10-CM

## 2016-03-25 ENCOUNTER — Telehealth: Payer: Self-pay | Admitting: Family Medicine

## 2016-03-25 ENCOUNTER — Encounter: Payer: Self-pay | Admitting: Family Medicine

## 2016-03-25 DIAGNOSIS — R768 Other specified abnormal immunological findings in serum: Secondary | ICD-10-CM

## 2016-03-25 NOTE — Telephone Encounter (Signed)
charge 

## 2016-03-25 NOTE — Telephone Encounter (Signed)
Patient was No Show for a CPE 10/23. Last No Show 9/26 with Jaime Leblanc. Charge or No Charge?

## 2016-03-26 ENCOUNTER — Encounter: Payer: Self-pay | Admitting: Medical

## 2016-03-26 ENCOUNTER — Ambulatory Visit (HOSPITAL_BASED_OUTPATIENT_CLINIC_OR_DEPARTMENT_OTHER)
Admission: RE | Admit: 2016-03-26 | Discharge: 2016-03-26 | Disposition: A | Discharge status: Home / Self Care | Payer: BLUE CROSS/BLUE SHIELD | Source: Ambulatory Visit | Attending: Medical | Admitting: Medical

## 2016-03-26 ENCOUNTER — Ambulatory Visit (INDEPENDENT_AMBULATORY_CARE_PROVIDER_SITE_OTHER): Payer: BLUE CROSS/BLUE SHIELD | Admitting: Medical

## 2016-03-26 VITALS — BP 116/80 | HR 78 | Temp 98.0°F | Ht 64.75 in | Wt 142.6 lb

## 2016-03-26 DIAGNOSIS — M791 Myalgia, unspecified site: Secondary | ICD-10-CM

## 2016-03-26 DIAGNOSIS — R635 Abnormal weight gain: Secondary | ICD-10-CM

## 2016-03-26 DIAGNOSIS — R600 Localized edema: Secondary | ICD-10-CM

## 2016-03-26 DIAGNOSIS — J029 Acute pharyngitis, unspecified: Secondary | ICD-10-CM

## 2016-03-26 DIAGNOSIS — R5383 Other fatigue: Secondary | ICD-10-CM

## 2016-03-26 DIAGNOSIS — Z87440 Personal history of urinary (tract) infections: Secondary | ICD-10-CM | POA: Diagnosis not present

## 2016-03-26 DIAGNOSIS — M2550 Pain in unspecified joint: Secondary | ICD-10-CM

## 2016-03-26 DIAGNOSIS — R0600 Dyspnea, unspecified: Secondary | ICD-10-CM | POA: Diagnosis present

## 2016-03-26 LAB — POCT URINALYSIS DIPSTICK
Bilirubin, UA: NEGATIVE
Blood, UA: NEGATIVE
Glucose, UA: NEGATIVE
KETONES UA: NEGATIVE
LEUKOCYTES UA: NEGATIVE
NITRITE UA: NEGATIVE
PH UA: 7
PROTEIN UA: NEGATIVE
Spec Grav, UA: 1.02
UROBILINOGEN UA: NEGATIVE

## 2016-03-26 LAB — CBC WITH DIFFERENTIAL/PLATELET
BASOS ABS: 0 10*3/uL (ref 0.0–0.1)
Basophils Relative: 0.3 % (ref 0.0–3.0)
Eosinophils Absolute: 0.1 10*3/uL (ref 0.0–0.7)
Eosinophils Relative: 0.7 % (ref 0.0–5.0)
HCT: 37.5 % (ref 36.0–46.0)
Hemoglobin: 12.6 g/dL (ref 12.0–15.0)
LYMPHS PCT: 12.3 % (ref 12.0–46.0)
Lymphs Abs: 1.1 10*3/uL (ref 0.7–4.0)
MCHC: 33.5 g/dL (ref 30.0–36.0)
MCV: 86.1 fl (ref 78.0–100.0)
MONOS PCT: 3.3 % (ref 3.0–12.0)
Monocytes Absolute: 0.3 10*3/uL (ref 0.1–1.0)
NEUTROS ABS: 7.1 10*3/uL (ref 1.4–7.7)
Neutrophils Relative %: 83.4 % — ABNORMAL HIGH (ref 43.0–77.0)
Platelets: 308 10*3/uL (ref 150.0–400.0)
RBC: 4.36 Mil/uL (ref 3.87–5.11)
RDW: 13.4 % (ref 11.5–15.5)
WBC: 8.6 10*3/uL (ref 4.0–10.5)

## 2016-03-26 LAB — POC INFLUENZA A&B (BINAX/QUICKVUE)
Influenza A, POC: NEGATIVE
Influenza B, POC: NEGATIVE

## 2016-03-26 LAB — COMPLETE METABOLIC PANEL WITH GFR
ALK PHOS: 59 U/L (ref 33–115)
ALT: 38 U/L — AB (ref 6–29)
AST: 26 U/L (ref 10–30)
Albumin: 4 g/dL (ref 3.6–5.1)
BILIRUBIN TOTAL: 0.4 mg/dL (ref 0.2–1.2)
BUN: 6 mg/dL — ABNORMAL LOW (ref 7–25)
CALCIUM: 9.2 mg/dL (ref 8.6–10.2)
CO2: 23 mmol/L (ref 20–31)
CREATININE: 0.68 mg/dL (ref 0.50–1.10)
Chloride: 108 mmol/L (ref 98–110)
GFR, Est Non African American: >89 mL/min (ref >=60)
Glucose, Bld: 105 mg/dL — ABNORMAL HIGH (ref 65–99)
Potassium: 3.5 mmol/L (ref 3.5–5.3)
Sodium: 140 mmol/L (ref 135–146)
TOTAL PROTEIN: 6.1 g/dL (ref 6.1–8.1)

## 2016-03-26 LAB — POCT URINE PREGNANCY: PREG TEST UR: NEGATIVE

## 2016-03-26 LAB — POCT RAPID STREP A (OFFICE): RAPID STREP A SCREEN: NEGATIVE

## 2016-03-26 LAB — TSH: TSH: 0.44 u[IU]/mL (ref 0.35–4.50)

## 2016-03-26 LAB — C-REACTIVE PROTEIN: CRP: 0.1 mg/dL — AB (ref 0.5–20.0)

## 2016-03-26 LAB — SEDIMENTATION RATE: Sed Rate: 4 mm/hr (ref 0–20)

## 2016-03-26 MED ORDER — FLUCONAZOLE 150 MG PO TABS: 150.0000 mg | ORAL_TABLET | Freq: Once | ORAL | 1 refills | Status: AC

## 2016-03-26 MED ORDER — AMOXICILLIN-POT CLAVULANATE 875-125 MG PO TABS: 1.0000 | ORAL_TABLET | Freq: Two times a day (BID) | ORAL | 0 refills | Status: DC

## 2016-03-26 MED ORDER — FLUTICASONE PROPIONATE 50 MCG/ACT NA SUSP: 2.0000 | Freq: Every day | NASAL | 1 refills | Status: DC

## 2016-03-26 NOTE — Patient Instructions (Addendum)
For your weight gain and fatigue will get cbc, cmp, tsh and we did urine preg test(was negative)  For history of uti got ua dip.   For joint pains and muscle aches got flu test, strep test and placed order ra panel. Also tick bite studies.  You do appear to have sinus infection so rx flonase for nasal congestion and augmentin antibiotic.  If you get thrush or yeast infection while on antibiotic then diflcan tablet sent to pharmacy.  Follow up in 7 days or as needed  You mentioned some possible dyspnea. Will get cxr today.(if any worse sob or any pain behind knees/popliteal areas as explained then notify us). In that event would get US studies. If any severe shortness of breath symptoms with pain behind knees then ED evaluation.

## 2016-03-26 NOTE — Progress Notes (Signed)
Subjective:    Patient ID: Jaime Leblanc, female    DOB: 01-17-1991, 25 y.o.   MRN: [id number]  HPI  Pt in states she has some nasal congestion mostly in the fall. Sometimes in the spring. Some pnd at times recently. Pt states no sinus pain. But over maxillary sinus she points out very swollen.(congestion and swelling last 3 days.)  Pt states some swelling in her body recently for 3 days. Pt feels like hands, face and feet most swollen. Pt also notes some wrist, and knee presently last 3 days.  She has history of some back pain/pressure.   Pt has gained some weight recently about 6 pounds. She has not been eating much. Pt feels like she has been urinating normally.Not concentrated appearance.   History of uti in past when she did not know she had.   LMP- was about 3.5 weeks ago. Pt has nexplanon presently. Will get that removed.    Review of Systems  Constitutional: Positive for fatigue and unexpected weight change. Negative for chills, diaphoresis and fever.  HENT: Positive for congestion and postnasal drip. Negative for drooling, ear pain, facial swelling, mouth sores and sore throat.   Respiratory: Negative for cough, chest tightness, shortness of breath and wheezing.        Mild sob maybe? Per pt. desribes more fatigue feeling.  Cardiovascular: Negative for chest pain and palpitations.  Gastrointestinal: Negative for abdominal distention, abdominal pain, anal bleeding, blood in stool, constipation, diarrhea, rectal pain and vomiting.  Endocrine: Negative for polydipsia and polyphagia.  Genitourinary: Negative for difficulty urinating, dyspareunia, dysuria, menstrual problem, vaginal bleeding and vaginal pain.  Musculoskeletal: Positive for arthralgias, back pain and myalgias. Negative for joint swelling and neck stiffness.  Skin: Negative for rash.  Neurological: Negative for dizziness, syncope, speech difficulty, weakness, light-headedness and headaches.  Hematological: Negative  for adenopathy. Does not bruise/bleed easily.  Psychiatric/Behavioral: Negative for agitation, behavioral problems, confusion, dysphoric mood, hallucinations and suicidal ideas. The patient is not nervous/anxious.    Past Medical History:  Diagnosis Date  . Anxiety   . Depression   . Fibromyalgia   . Headache   . HPV in female   . Neuropathy (HCC)    Left hand and in muscle all over the body  . OCD (obsessive compulsive disorder)   . Optic neuritis    Left Eye  . Panic disorder      Social History   Social History  . Marital status: Single    Spouse name: N/A  . Number of children: N/A  . Years of education: N/A   Occupational History  . Not on file.   Social History Main Topics  . Smoking status: Former Smoker    Quit date: 08/15/2010  . Smokeless tobacco: Not on file  . Alcohol use Yes     Comment: Maybe once a month  . Drug use: No  . Sexual activity: Not Currently   Other Topics Concern  . Not on file   Social History Narrative  . No narrative on file    Past Surgical History:  Procedure Laterality Date  . WISDOM TOOTH EXTRACTION      Family History  Problem Relation Age of Onset  . Colon cancer Mother   . Hypertension Mother   . Depression Mother   . Anxiety disorder Mother   . Hypertension Father   . Gout Father     Allergies  Allergen Reactions  . Azithromycin Itching  . Lamotrigine Rash  .  Codeine Itching  . Lidocaine Other (See Comments)    Skin reaction  . Menthol Itching    Menthol in lotion-pt states this makes her skin feel like it is burning  . Topiramate Other (See Comments)    Leg pain    Current Outpatient Prescriptions on File Prior to Visit  Medication Sig Dispense Refill  . ADDERALL XR 10 MG 24 hr capsule Take 1 capsule by mouth daily.  0  . ALPRAZolam (XANAX) 1 MG tablet Take 1 mg by mouth 3 (three) times daily.    . cyanocobalamin (,VITAMIN B-12,) 1000 MCG/ML injection     . diphenhydrAMINE (BENADRYL) 25 mg capsule  Take 25 mg by mouth as needed.     . eletriptan (RELPAX) 40 MG tablet Take 1 tablet (40 mg total) by mouth as needed for migraine or headache. May repeat in 2 hours if headache persists or recurs. 10 tablet 0  . etonogestrel (NEXPLANON) 68 MG IMPL implant Inject into the skin.    . hydrOXYzine (ATARAX/VISTARIL) 25 MG tablet Take 1 tablet (25 mg total) by mouth 3 (three) times daily as needed. 30 tablet 0  . ketoconazole (NIZORAL) 200 MG tablet Take 1 tablet by mouth daily. For 2 days (Only takes in Feb)    . oxyCODONE-acetaminophen (PERCOCET) 7.5-325 MG tablet Take 1 tablet by mouth every 6 (six) hours as needed for severe pain. 90 tablet 0  . promethazine (PHENERGAN) 25 MG tablet Take 1 tablet (25 mg total) by mouth every 8 (eight) hours as needed for nausea or vomiting. 20 tablet 0  . sertraline (ZOLOFT) 100 MG tablet Take 200 mg by mouth daily.    . traZODone (DESYREL) 50 MG tablet Take 1-2 mg by mouth at bedtime.  2  . valACYclovir (VALTREX) 1000 MG tablet Take two tabs by mouth every 12 hours for one day.  Take at onset of cold sore 4 tablet 3   No current facility-administered medications on file prior to visit.     BP 116/80 (BP Location: Right Arm, Patient Position: Sitting)   Pulse 78   Temp 98 F (36.7 C) (Oral)   Ht 5' 4.75" (1.645 m)   Wt 142 lb 9.6 oz (64.7 kg)   SpO2 98%   BMI 23.91 kg/m       Objective:   Physical Exam  General  Mental Status - Alert. General Appearance - Well groomed. Not in acute distress.  Skin Rashes- No Rashes.  HEENT Head- Normal. Ear Auditory Canal - Left- Normal. Right - Normal.Tympanic Membrane- Left- Normal. Right- Normal. Eye Sclera/Conjunctiva- Left- Normal. Right- Normal. Nose & Sinuses Nasal Mucosa- Left-  Boggy and Congested. Right-  Boggy and  Congested.Bilateral maxillary and frontal sinus pressure.(face of her cheeks are puffy/swollen)  Mouth & Throat Lips: Upper Lip- Normal: no dryness, cracking, pallor, cyanosis, or  vesicular eruption. Lower Lip-Normal: no dryness, cracking, pallor, cyanosis or vesicular eruption. Buccal Mucosa- Bilateral- No Aphthous ulcers. Oropharynx- No Discharge or Erythema. Tonsils: Characteristics- Bilateral- mild  Erythema but no  Congestion. Size/Enlargement- Bilateral- No enlargement. Discharge- bilateral-None.  Neck Neck- Supple. No Masses.   Chest and Lung Exam Auscultation: Breath Sounds:-Clear even and unlabored.  Cardiovascular Auscultation:Rythm- Regular, rate and rhythm. Murmurs & Other Heart Sounds:Ausculatation of the heart reveal- No Murmurs.  Lymphatic Head & Neck General Head & Neck Lymphatics: Bilateral: Description- No Localized lymphadenopathy.  Lower ext- faint 1+ pedal at best. Neg homans signs.   Abdomen Inspection:-Inspeection Normal. Palpation/Percussion:Note:No mass. Palpation and Percussion of the  abdomen reveal- Non Tender, Non Distended + BS, no rebound or guarding.    Neurologic Cranial Nerve exam:- CN III-XII intact(No nystagmus), symmetric smile. Drift Test:- No drift. Romberg Exam:- Negative.  Heal to Toe Gait exam:-Normal. Finger to Nose:- Normal/Intact Strength:- 5/5 equal and symmetric strength both upper and lower extremities.  Back- no cva pain.  Ext- on rom wrist pain, elbow pain and knee pain. Some ankle pain as well on palpation bilaterally.      Assessment & Plan:  For your weight gain and fatigue will get cbc, cmp, tsh and we did urine preg test(was negative)  For history of uti got ua dip.   For joint pains and muscle aches got flu test, strep test and placed order ra panel. Also tick bite studies.  You do appear to have sinus infection so rx flonase for nasal congestion and augmentin antibiotic.  If you get thrush or yeast infection while on antibiotic then diflcan tablet sent to pharmacy.  Follow up in 7 days or as needed  You mentioned some possible dyspnea. Will get cxr today.(if any worse sob or any  pain behind knees/popliteal areas as explained then notify us). In that event would get US studies. If any severe shortness of breath symptoms with pain behind knees then ED evaluation.  Swayzee Wadley, Ramon DredgeEdward, PA-C

## 2016-03-26 NOTE — Progress Notes (Signed)
Pre visit review using our clinic review tool, if applicable. No additional management support is needed unless otherwise documented below in the visit note. 

## 2016-03-27 LAB — ANA: Anti Nuclear Antibody(ANA): NEGATIVE

## 2016-03-27 LAB — LYME AB/WESTERN BLOT REFLEX

## 2016-03-27 LAB — RHEUMATOID FACTOR: Rhuematoid fact SerPl-aCnc: <14 IU/mL (ref <14)

## 2016-03-28 LAB — ROCKY MTN SPOTTED FVR ABS PNL(IGG+IGM)
RMSF IGG: NOT DETECTED
RMSF IGM: NOT DETECTED

## 2016-03-31 LAB — LYME ABY, WSTRN BLT IGG & IGM W/BANDS
B BURGDORFERI IGM ABS (IB): NEGATIVE
B burgdorferi IgG Abs (IB): NEGATIVE
LYME DISEASE 18 KD IGG: NONREACTIVE
LYME DISEASE 23 KD IGG: NONREACTIVE
LYME DISEASE 23 KD IGM: NONREACTIVE
LYME DISEASE 39 KD IGG: REACTIVE — AB
LYME DISEASE 39 KD IGM: NONREACTIVE
LYME DISEASE 41 KD IGM: REACTIVE — AB
LYME DISEASE 58 KD IGG: NONREACTIVE
Lyme Disease 28 kD IgG: NONREACTIVE
Lyme Disease 30 kD IgG: NONREACTIVE
Lyme Disease 41 kD IgG: NONREACTIVE
Lyme Disease 45 kD IgG: NONREACTIVE
Lyme Disease 66 kD IgG: NONREACTIVE
Lyme Disease 93 kD IgG: NONREACTIVE

## 2016-04-01 NOTE — Telephone Encounter (Signed)
Referral to ID placed. Send to prior ID who saw her please.See referral.

## 2016-04-02 ENCOUNTER — Ambulatory Visit: Payer: Self-pay | Admitting: Medical

## 2016-04-02 ENCOUNTER — Other Ambulatory Visit: Payer: Self-pay | Admitting: Family Medicine

## 2016-04-02 DIAGNOSIS — Z8669 Personal history of other diseases of the nervous system and sense organs: Secondary | ICD-10-CM

## 2016-04-02 NOTE — Telephone Encounter (Signed)
Caller name: Relationship to patient: Self Can be reached: [phone] Pharmacy:  [id number] - [place], [place] - [address] [phone] (Phone) [phone])     Reason for call: refill eletriptan (RELPAX) 40 MG tablet [[id number]

## 2016-04-03 MED ORDER — ELETRIPTAN HYDROBROMIDE 40 MG PO TABS: 40.0000 mg | ORAL_TABLET | ORAL | 0 refills | Status: DC | PRN

## 2016-04-03 NOTE — Telephone Encounter (Signed)
Medication filled to pharmacy as requested.   

## 2016-04-04 ENCOUNTER — Encounter: Payer: Self-pay | Admitting: Family Medicine

## 2016-04-04 ENCOUNTER — Ambulatory Visit: Payer: Self-pay | Admitting: Medical

## 2016-04-04 DIAGNOSIS — Z0289 Encounter for other administrative examinations: Secondary | ICD-10-CM

## 2016-04-09 ENCOUNTER — Telehealth: Payer: Self-pay | Admitting: Family Medicine

## 2016-04-09 NOTE — Telephone Encounter (Signed)
Caller name: Relationship to patient: Self Can be reached: [phone]  Pharmacy:  Reason for call: Request refill on ALPRAZolam Prudy Feeler(XANAX) 1 MG tablet [[id number]][[id number]] Until she can go back to see her new therapist next month. Need only 1 month supply if possible. States they she could not see her therapist this month because she lost her job and did not have co-pay.

## 2016-04-09 NOTE — Telephone Encounter (Signed)
Please advise 

## 2016-04-10 ENCOUNTER — Other Ambulatory Visit: Payer: Self-pay

## 2016-04-10 MED ORDER — ALPRAZOLAM 1 MG PO TABS: 1.0000 mg | ORAL_TABLET | Freq: Three times a day (TID) | ORAL | 0 refills | Status: DC

## 2016-04-10 NOTE — Telephone Encounter (Signed)
Medication faxed to pharmacy per patient request.

## 2016-04-10 NOTE — Telephone Encounter (Signed)
Refill x1 

## 2016-04-10 NOTE — Telephone Encounter (Signed)
Patient called following up on situation.

## 2016-04-10 NOTE — Telephone Encounter (Signed)
Rx printed and forwarded to PCP for review and signature.   

## 2016-04-30 ENCOUNTER — Telehealth: Payer: Self-pay | Admitting: Family Medicine

## 2016-04-30 DIAGNOSIS — Z8669 Personal history of other diseases of the nervous system and sense organs: Secondary | ICD-10-CM

## 2016-04-30 MED ORDER — ELETRIPTAN HYDROBROMIDE 40 MG PO TABS: 40.0000 mg | ORAL_TABLET | ORAL | 0 refills | Status: DC | PRN

## 2016-04-30 MED FILL — ELETRIPTAN HBR 40 MG TABLET: 40 | 30 days supply | Qty: 10 | Fill #0

## 2016-04-30 NOTE — Telephone Encounter (Signed)
Rx sent. LB

## 2016-04-30 NOTE — Telephone Encounter (Signed)
Patient is requesting a refill for eletriptan pharmacy is [facility]. Patient has 1 left

## 2016-05-12 ENCOUNTER — Telehealth: Payer: Self-pay | Admitting: Family Medicine

## 2016-05-12 DIAGNOSIS — L299 Pruritus, unspecified: Secondary | ICD-10-CM

## 2016-05-12 MED ORDER — HYDROXYZINE HCL 25 MG PO TABS: 25.0000 mg | ORAL_TABLET | Freq: Three times a day (TID) | ORAL | 0 refills | Status: DC | PRN

## 2016-05-12 MED ORDER — TRAZODONE HCL 50 MG PO TABS: ORAL_TABLET | ORAL | 0 refills | Status: DC

## 2016-05-12 MED ORDER — ALPRAZOLAM 1 MG PO TABS: 1.0000 mg | ORAL_TABLET | Freq: Three times a day (TID) | ORAL | 0 refills | Status: DC

## 2016-05-12 NOTE — Telephone Encounter (Signed)
Caller name: Relationship to patient: Self Can be reached: [phone]  Pharmacy:  [id number] - [place], [place] - [address] [phone] (Phone) [phone] (Fax)     Reason for call: Refill on traZODone (DESYREL) 50 MG tablet [[id number] hydrOXYzine (ATARAX/VISTARIL) 25 MG tablet [[id number]][[id number]] ALPRAZolam (XANAX) 1 MG tablet [[id number]

## 2016-05-12 NOTE — Telephone Encounter (Signed)
yes

## 2016-05-12 NOTE — Telephone Encounter (Signed)
Patient has an appointment scheduled on 06/06/16.  Do you want to approve her prescriptions?  Had a no show letter sent out on 04/04/16

## 2016-05-14 ENCOUNTER — Telehealth: Payer: Self-pay

## 2016-05-14 NOTE — Telephone Encounter (Signed)
Faxed Rx. LB 

## 2016-06-06 ENCOUNTER — Other Ambulatory Visit: Payer: Self-pay

## 2016-06-06 ENCOUNTER — Telehealth: Payer: Self-pay | Admitting: Family Medicine

## 2016-06-06 ENCOUNTER — Encounter (INDEPENDENT_AMBULATORY_CARE_PROVIDER_SITE_OTHER): Payer: Self-pay | Admitting: Family Medicine

## 2016-06-06 DIAGNOSIS — Z8669 Personal history of other diseases of the nervous system and sense organs: Secondary | ICD-10-CM

## 2016-06-06 DIAGNOSIS — Z0289 Encounter for other administrative examinations: Secondary | ICD-10-CM

## 2016-06-06 DIAGNOSIS — R110 Nausea: Secondary | ICD-10-CM

## 2016-06-06 MED ORDER — FLUTICASONE PROPIONATE 50 MCG/ACT NA SUSP: 2.0000 | Freq: Every day | NASAL | 1 refills | Status: DC

## 2016-06-06 MED ORDER — PROMETHAZINE HCL 25 MG PO TABS: 25.0000 mg | ORAL_TABLET | Freq: Three times a day (TID) | ORAL | 0 refills | Status: DC | PRN

## 2016-06-06 MED ORDER — ELETRIPTAN HYDROBROMIDE 40 MG PO TABS: 40.0000 mg | ORAL_TABLET | ORAL | 0 refills | Status: DC | PRN

## 2016-06-06 NOTE — Telephone Encounter (Signed)
Caller name: [name] Relationship to patient: self Can be reached: [phone] Pharmacy: [id number] - [place], [place] - [address]  Reason for call: Pt said there was family ER today and she does not have a car now. She will not be able to be here today for appt. Pt has rescheduled for 2/19 and on wait list. Charge or no charge?  Pt needing refill on Eletriptan, fluticasone, and promethazine please. She will call next week for the refills on controlled meds.

## 2016-06-06 NOTE — Telephone Encounter (Signed)
Called left message to call back 

## 2016-06-06 NOTE — Telephone Encounter (Signed)
Called left message to call back. If I am correct we do not check blood type here.  Advise where she can go to have it checked.

## 2016-06-06 NOTE — Telephone Encounter (Signed)
If she donates blood they will check it ,  If she had surgery they would have checked it

## 2016-06-06 NOTE — Telephone Encounter (Signed)
Relation to ZH:YQMVpt:self Call back number: [phone]   Reason for call:  Patient would like to know her blood type, please advise

## 2016-06-06 NOTE — Telephone Encounter (Signed)
Rx sent to pharmacy. LB 

## 2016-06-09 ENCOUNTER — Telehealth: Payer: Self-pay | Admitting: Family Medicine

## 2016-06-09 NOTE — Telephone Encounter (Signed)
charge 

## 2016-06-09 NOTE — Telephone Encounter (Signed)
Patient was No SHow for appt on 1/5. Previous No Shows 11/3 and 10/23. Charge or No Charge?

## 2016-06-10 ENCOUNTER — Telehealth: Payer: Self-pay

## 2016-06-10 ENCOUNTER — Other Ambulatory Visit: Payer: Self-pay | Admitting: Family Medicine

## 2016-06-10 ENCOUNTER — Encounter: Payer: Self-pay | Admitting: Family Medicine

## 2016-06-10 MED ORDER — ALPRAZOLAM 1 MG PO TABS: 1.0000 mg | ORAL_TABLET | Freq: Three times a day (TID) | ORAL | 0 refills | Status: DC

## 2016-06-10 NOTE — Telephone Encounter (Signed)
Last seen 03/26/16 Last filled 05/12/16 Last UDS 01/17/16 low risk  Please advise PC   Patient had a no show with you on 06/07/15 Due to family in the ER , charge or no charge

## 2016-06-10 NOTE — Telephone Encounter (Signed)
Rx faxed to pharmacy. LB 

## 2016-06-10 NOTE — Telephone Encounter (Signed)
Refill x1  2 refill 

## 2016-06-10 NOTE — Telephone Encounter (Signed)
Last ov 03/26/16. Last fill 05/12/16 #60 0. Last UDS 01/17/16. Next screen 07/09/16. Please advise. LB

## 2016-06-13 ENCOUNTER — Other Ambulatory Visit: Payer: Self-pay | Admitting: Family Medicine

## 2016-06-13 DIAGNOSIS — L299 Pruritus, unspecified: Secondary | ICD-10-CM

## 2016-06-16 MED ORDER — HYDROXYZINE HCL 25 MG PO TABS: 25.0000 mg | ORAL_TABLET | Freq: Three times a day (TID) | ORAL | 0 refills | Status: DC | PRN

## 2016-06-19 MED FILL — ALPRAZolam 1 MG TABS: 1 | 30 days supply | Qty: 90 | Fill #0

## 2016-06-19 MED FILL — hydrOXYzine HCL 25 MG TABS: 25 | 10 days supply | Qty: 30 | Fill #0

## 2016-06-21 ENCOUNTER — Emergency Department (HOSPITAL_COMMUNITY)
Admission: EM | Admit: 2016-06-21 | Discharge: 2016-06-25 | Disposition: A | Discharge status: Other Acute Inpatient Hospital | Payer: BC Managed Care – PPO | Attending: Emergency Medicine | Admitting: Emergency Medicine

## 2016-06-21 ENCOUNTER — Encounter (HOSPITAL_COMMUNITY): Payer: Self-pay

## 2016-06-21 DIAGNOSIS — S50812A Abrasion of left forearm, initial encounter: Secondary | ICD-10-CM | POA: Insufficient documentation

## 2016-06-21 DIAGNOSIS — T07XXXA Unspecified multiple injuries, initial encounter: Secondary | ICD-10-CM

## 2016-06-21 DIAGNOSIS — X58XXXA Exposure to other specified factors, initial encounter: Secondary | ICD-10-CM | POA: Diagnosis not present

## 2016-06-21 DIAGNOSIS — Z79899 Other long term (current) drug therapy: Secondary | ICD-10-CM | POA: Diagnosis not present

## 2016-06-21 DIAGNOSIS — F338 Other recurrent depressive disorders: Secondary | ICD-10-CM | POA: Diagnosis not present

## 2016-06-21 DIAGNOSIS — R45851 Suicidal ideations: Secondary | ICD-10-CM | POA: Insufficient documentation

## 2016-06-21 DIAGNOSIS — Z87891 Personal history of nicotine dependence: Secondary | ICD-10-CM | POA: Diagnosis not present

## 2016-06-21 DIAGNOSIS — Y939 Activity, unspecified: Secondary | ICD-10-CM | POA: Insufficient documentation

## 2016-06-21 DIAGNOSIS — F339 Major depressive disorder, recurrent, unspecified: Secondary | ICD-10-CM | POA: Diagnosis present

## 2016-06-21 DIAGNOSIS — F429 Obsessive-compulsive disorder, unspecified: Secondary | ICD-10-CM | POA: Insufficient documentation

## 2016-06-21 DIAGNOSIS — Y929 Unspecified place or not applicable: Secondary | ICD-10-CM | POA: Insufficient documentation

## 2016-06-21 DIAGNOSIS — Z8249 Family history of ischemic heart disease and other diseases of the circulatory system: Secondary | ICD-10-CM | POA: Diagnosis not present

## 2016-06-21 DIAGNOSIS — S0081XA Abrasion of other part of head, initial encounter: Secondary | ICD-10-CM | POA: Insufficient documentation

## 2016-06-21 DIAGNOSIS — Y999 Unspecified external cause status: Secondary | ICD-10-CM | POA: Insufficient documentation

## 2016-06-21 DIAGNOSIS — Z818 Family history of other mental and behavioral disorders: Secondary | ICD-10-CM | POA: Diagnosis not present

## 2016-06-21 DIAGNOSIS — Z800 Family history of malignant neoplasm of digestive organs: Secondary | ICD-10-CM | POA: Diagnosis not present

## 2016-06-21 LAB — RAPID URINE DRUG SCREEN, HOSP PERFORMED
AMPHETAMINES: NOT DETECTED
Barbiturates: NOT DETECTED
Benzodiazepines: POSITIVE — AB
COCAINE: NOT DETECTED
OPIATES: NOT DETECTED
Tetrahydrocannabinol: NOT DETECTED

## 2016-06-21 LAB — COMPREHENSIVE METABOLIC PANEL
ALK PHOS: 70 U/L (ref 38–126)
ALT: 28 U/L (ref 14–54)
ANION GAP: 9 (ref 5–15)
AST: 24 U/L (ref 15–41)
Albumin: 4.9 g/dL (ref 3.5–5.0)
BUN: 16 mg/dL (ref 6–20)
CO2: 23 mmol/L (ref 22–32)
CREATININE: 0.8 mg/dL (ref 0.44–1.00)
Calcium: 9.5 mg/dL (ref 8.9–10.3)
Chloride: 107 mmol/L (ref 101–111)
GFR calc non Af Amer: >60 mL/min (ref >60)
Glucose, Bld: 80 mg/dL (ref 65–99)
Potassium: 3.9 mmol/L (ref 3.5–5.1)
Sodium: 139 mmol/L (ref 135–145)
TOTAL PROTEIN: 8.2 g/dL — AB (ref 6.5–8.1)
Total Bilirubin: 0.5 mg/dL (ref 0.3–1.2)

## 2016-06-21 LAB — URINALYSIS, ROUTINE W REFLEX MICROSCOPIC
Bilirubin Urine: NEGATIVE
GLUCOSE, UA: NEGATIVE mg/dL
Hgb urine dipstick: NEGATIVE
Ketones, ur: NEGATIVE mg/dL
LEUKOCYTES UA: NEGATIVE
Nitrite: NEGATIVE
PROTEIN: NEGATIVE mg/dL
SPECIFIC GRAVITY, URINE: 1.019 (ref 1.005–1.030)
pH: 5 (ref 5.0–8.0)

## 2016-06-21 LAB — PREGNANCY, URINE: PREG TEST UR: NEGATIVE

## 2016-06-21 LAB — CBC WITH DIFFERENTIAL/PLATELET
BASOS PCT: 1 %
Basophils Absolute: 0.1 10*3/uL (ref 0.0–0.1)
EOS ABS: 0.2 10*3/uL (ref 0.0–0.7)
EOS PCT: 2 %
HCT: 45.8 % (ref 36.0–46.0)
Hemoglobin: 15.6 g/dL — ABNORMAL HIGH (ref 12.0–15.0)
LYMPHS ABS: 3.1 10*3/uL (ref 0.7–4.0)
Lymphocytes Relative: 30 %
MCH: 28.9 pg (ref 26.0–34.0)
MCHC: 34.1 g/dL (ref 30.0–36.0)
MCV: 85 fL (ref 78.0–100.0)
Monocytes Absolute: 0.7 10*3/uL (ref 0.1–1.0)
Monocytes Relative: 7 %
NEUTROS PCT: 60 %
Neutro Abs: 6.2 10*3/uL (ref 1.7–7.7)
PLATELETS: 435 10*3/uL — AB (ref 150–400)
RBC: 5.39 MIL/uL — AB (ref 3.87–5.11)
RDW: 13.5 % (ref 11.5–15.5)
WBC: 10.2 10*3/uL (ref 4.0–10.5)

## 2016-06-21 LAB — SALICYLATE LEVEL

## 2016-06-21 LAB — ETHANOL

## 2016-06-21 LAB — ACETAMINOPHEN LEVEL: Acetaminophen (Tylenol), Serum: <10 ug/mL — ABNORMAL LOW (ref 10–30)

## 2016-06-21 MED ORDER — LORAZEPAM 1 MG PO TABS: 1.0000 mg | ORAL_TABLET | Freq: Once | ORAL | Status: DC

## 2016-06-21 MED ORDER — LORAZEPAM 1 MG PO TABS
1.0000 mg | ORAL_TABLET | Freq: Three times a day (TID) | ORAL | Status: DC | PRN
  Administered 2016-06-22 (×3): 1 mg via ORAL
  Filled 2016-06-21 (×3): qty 1

## 2016-06-21 MED ORDER — HYDROXYZINE HCL 25 MG PO TABS
25.0000 mg | ORAL_TABLET | Freq: Three times a day (TID) | ORAL | Status: DC | PRN
  Filled 2016-06-21: qty 1

## 2016-06-21 MED ORDER — ACETAMINOPHEN 325 MG PO TABS
650.0000 mg | ORAL_TABLET | ORAL | Status: DC | PRN
  Administered 2016-06-23 – 2016-06-25 (×5): 650 mg via ORAL
  Filled 2016-06-21 (×6): qty 2

## 2016-06-21 MED ORDER — ONDANSETRON HCL 4 MG PO TABS: 4.0000 mg | ORAL_TABLET | Freq: Three times a day (TID) | ORAL | Status: DC | PRN

## 2016-06-21 MED ORDER — IBUPROFEN 200 MG PO TABS
600.0000 mg | ORAL_TABLET | Freq: Three times a day (TID) | ORAL | Status: DC | PRN
  Administered 2016-06-22 – 2016-06-23 (×3): 600 mg via ORAL
  Filled 2016-06-21 (×3): qty 3

## 2016-06-21 MED ORDER — TRAZODONE HCL 50 MG PO TABS
50.0000 mg | ORAL_TABLET | Freq: Every day | ORAL | Status: DC
  Administered 2016-06-21: 100 mg via ORAL
  Filled 2016-06-21: qty 2

## 2016-06-21 MED ORDER — ALUM & MAG HYDROXIDE-SIMETH 200-200-20 MG/5ML PO SUSP: 30.0000 mL | ORAL | Status: DC | PRN

## 2016-06-21 MED ORDER — LORAZEPAM 2 MG/ML IJ SOLN
2.0000 mg | Freq: Once | INTRAMUSCULAR | Status: AC
  Administered 2016-06-21: 2 mg via INTRAMUSCULAR
  Filled 2016-06-21: qty 1

## 2016-06-21 MED ORDER — SERTRALINE HCL 50 MG PO TABS
200.0000 mg | ORAL_TABLET | Freq: Every day | ORAL | Status: DC
  Administered 2016-06-22 – 2016-06-25 (×4): 200 mg via ORAL
  Filled 2016-06-21 (×5): qty 4

## 2016-06-21 MED ORDER — LEVONORGEST-ETH ESTRAD 91-DAY 0.15-0.03 &0.01 MG PO TABS
1.0000 | ORAL_TABLET | Freq: Every day | ORAL | Status: DC
  Administered 2016-06-24 – 2016-06-25 (×2): 1 via ORAL

## 2016-06-21 MED ORDER — HYDROCODONE-ACETAMINOPHEN 5-325 MG PO TABS
1.0000 | ORAL_TABLET | Freq: Four times a day (QID) | ORAL | Status: DC | PRN
  Administered 2016-06-21 – 2016-06-23 (×5): 1 via ORAL
  Filled 2016-06-21 (×5): qty 1

## 2016-06-21 NOTE — Progress Notes (Signed)
Per Nira ConnJason Berry, NP meets inpatient criteria for psychiatric care. Andee Chivers K. Sherlon HandingHarris, LCAS-A, LPC-A, [facility]  Counselor 06/21/2016 10:28 PM

## 2016-06-21 NOTE — ED Triage Notes (Signed)
She reports being suicidal without plan. She has been dx with a "spine problem that causes a lot of pain". She has had two friends die of drug overdose, therefore she is afraid to "take strong pain medicine."  She arrives with a [place] [employer] seismologistheriff deputy.

## 2016-06-21 NOTE — ED Notes (Signed)
SBAR Report received from previous nurse. Pt received calm and visible on unit. Pt denies current SI/ HI, A/V H or pain at this time, and appears otherwise stable. Pt endorses pain 7/10 where she has had a cyst rupture. And reporetd anxiety 8/10, and depression related to her pain and fear that the pain will not be controlled. Pt reports seeing a pain specialist but that her pain is still not controlled  Pt reminded of camera surveillance, q 15 min rounds, and rules of the milieu. Will continue to assess.

## 2016-06-21 NOTE — BH Assessment (Signed)
Tele Assessment Note   Barbaraann RondoLaura Sesay is an 26 y.o. female, Caucasian, Single who presents to [facility] ED per ED report: presents to the ED today with suicidal ideation.  She said that she has chronic pain which makes her very anxious.  She said she goes through periods of deep, dark depression and panic attacks which overwhelm her.  The pt said that she has medications for pain, but is afraid to take them because 2 friends have died from drug abuse.  The pt made superficial abrasions to her left forearm and to her face to try to hurt herself.  She said that she is very anxious right now.  She called the police who brought her here. Mother and father were present during assessment per pt. Request. Patient primary concern is chronic pain and ongoing depression. Patient states that she has been dealing with chronic pain for quite some time and it  Has interfered with her college completion, daily activities, and overall mental health. Patient states that she did recall that she helped take care of her mother when pt was a child because mother had cancer. Patient also states that she has had ongoing problem with cysts, chronic pain in ovarian area and ongoing overall pain. Patient states that about x 1.5 to 2 years ago was prescribed Symbalta and Gabapentin for depression and was taking it for at least 8-9 months c/o the medication changing personality/ mood pt. Did not feel like dosing things she used to and per mother report it had an unpleasant reaction with noticeable alteration in persona/personality. Patients states that currently she has heightened pain level and is not wanting to take certain pain medication due to her fear and friend who died of drug overdose. Patient currently resides with mother and father.  Patient acknowledges current SI and earlier plan to cut wrist and even tried put trash bag over her head. Patient has noticeable scratches on face and wrist, and pt admits she just started  cutting. Patient states that she just feels like she does not want to live anymore. Patient denies HI and AVH as well as S.A. Patient denies any hx. Of inpatient psychiatric care. Patient states she was seen at or around 2016/17 [facility] Dr. Shelly CossBarbara Morgan for depression and chronic pain.   Patient is dressed in hospital gown and is alert and oriented x4. Patient speech was within normal limits and motor behavior appeared normal. Patient thought process is coherent. Patient does not appear to be responding to internal stimuli. Patient was cooperative throughout the assessment and states that  she is agreeable to inpatient psychiatric treatment.   Diagnosis: Major Depressive Disorder, Recurrent,Severe  Past Medical History:  Past Medical History:  Diagnosis Date  . Anxiety   . Depression   . Fibromyalgia   . Headache   . HPV in female   . Neuropathy (HCC)    Left hand and in muscle all over the body  . OCD (obsessive compulsive disorder)   . Optic neuritis    Left Eye  . Panic disorder     Past Surgical History:  Procedure Laterality Date  . WISDOM TOOTH EXTRACTION      Family History:  Family History  Problem Relation Age of Onset  . Colon cancer Mother   . Hypertension Mother   . Depression Mother   . Anxiety disorder Mother   . Hypertension Father   . Gout Father     Social History:  reports that she  quit smoking about 5 years ago. She does not have any smokeless tobacco history on file. She reports that she drinks alcohol. She reports that she does not use drugs.  Additional Social History:  Alcohol / Drug Use Pain Medications: SEE MAR Prescriptions: SEE MAR Over the Counter: SEE MAR History of alcohol / drug use?: No history of alcohol / drug abuse  CIWA: CIWA-Ar BP: 120/93 Pulse Rate: 110 COWS:    PATIENT STRENGTHS: (choose at least two) Average or above average intelligence Capable of independent living Communication  skills  Allergies:  Allergies  Allergen Reactions  . Azithromycin Itching  . Lamotrigine Rash  . Codeine Itching  . Lidocaine Other (See Comments)    Skin reaction  . Menthol Itching    Menthol in lotion-pt states this makes her skin feel like it is burning  . Topiramate Other (See Comments)    Leg pain    Home Medications:  (Not in a hospital admission)  OB/GYN Status:  No LMP recorded. Patient has had an implant.  General Assessment Data Location of Assessment: [facility] ED TTS Assessment: In system Is this a Tele or Face-to-Face Assessment?: Face-to-Face Is this an Initial Assessment or a Re-assessment for this encounter?: Initial Assessment Marital status: Single Maiden name: n/a Is patient pregnant?: Unknown Pregnancy Status: Unknown Living Arrangements: Parent Can pt return to current living arrangement?: Yes Admission Status: Voluntary Is patient capable of signing voluntary admission?: Yes Referral Source: Self/Family/Friend Insurance type: BCBS     Crisis Care Plan Living Arrangements: Parent Legal Guardian: Other: (none) Name of Psychiatrist: Past: DR Shelly Coss [facility] Name of Therapist: In past [facility]  Education Status Is patient currently in school?: No Current Grade: n/a Highest grade of school patient has completed: some college Name of school: n/a Contact person: Mother and father  Risk to self with the past 6 months Suicidal Ideation: Yes-Currently Present Has patient been a risk to self within the past 6 months prior to admission? : Yes Suicidal Intent: Yes-Currently Present Has patient had any suicidal intent within the past 6 months prior to admission? : Yes Is patient at risk for suicide?: Yes Suicidal Plan?: Yes-Currently Present Has patient had any suicidal plan within the past 6 months prior to admission? : Yes Specify Current Suicidal Plan: cut wrist / put bag over head Access to Means:  Yes Specify Access to Suicidal Means: access to sharps What has been your use of drugs/alcohol within the last 12 months?: none Previous Attempts/Gestures: Yes How many times?: 4 Other Self Harm Risks: cutting current Triggers for Past Attempts: Other (Comment) (pain) Intentional Self Injurious Behavior: Cutting, Damaging Comment - Self Injurious Behavior: cutting/ scratched face and wrist Family Suicide History: No Recent stressful life event(s): Recent negative physical changes, Trauma (Comment) (trauma/ support mother in childhood when mom had cancer) Persecutory voices/beliefs?: No Depression: Yes Depression Symptoms: Despondent, Insomnia, Tearfulness, Isolating, Fatigue, Guilt, Loss of interest in usual pleasures, Feeling worthless/self pity Substance abuse history and/or treatment for substance abuse?: No Suicide prevention information given to non-admitted patients: Yes  Risk to Others within the past 6 months Homicidal Ideation: No Does patient have any lifetime risk of violence toward others beyond the six months prior to admission? : No Thoughts of Harm to Others: No Current Homicidal Intent: No Current Homicidal Plan: No Access to Homicidal Means: No Identified Victim: none History of harm to others?: No Assessment of Violence: None Noted Violent Behavior Description: n/a Does patient have access to  weapons?: No Criminal Charges Pending?: No Does patient have a court date: No Is patient on probation?: No  Psychosis Hallucinations: None noted Delusions: None noted  Mental Status Report Appearance/Hygiene: In hospital gown Eye Contact: Fair Motor Activity: Freedom of movement Speech: Logical/coherent Level of Consciousness: Alert Mood: Depressed Affect: Depressed Anxiety Level: Severe Thought Processes: Relevant Judgement: Unimpaired Orientation: Person, Place, Time, Situation, Appropriate for developmental age Obsessive Compulsive Thoughts/Behaviors:  Moderate  Cognitive Functioning Concentration: Decreased Memory: Recent Intact, Remote Intact IQ: Average Insight: Good Impulse Control: Poor Appetite: Fair Weight Loss: 0 Weight Gain: 0 Sleep: Decreased Total Hours of Sleep: 3 Vegetative Symptoms: None  ADLScreening [facility] Assessment Services) Patient's cognitive ability adequate to safely complete daily activities?: Yes Patient able to express need for assistance with ADLs?: Yes Independently performs ADLs?: Yes (appropriate for developmental age)  Prior Inpatient Therapy Prior Inpatient Therapy: No Prior Therapy Dates: n/a Prior Therapy Facilty/Provider(s): n/a Reason for Treatment: n/a  Prior Outpatient Therapy Prior Outpatient Therapy: Yes Prior Therapy Dates: 2016 Prior Therapy [facility]ilty[facility]Provider([facility]): [facility] Reason for Treatment: depression/ chronic pain Does patient have an ACCT team?: No Does patient have Intensive In-House Services?  : No Does patient have [facility] services? : No Does patient have P4CC services?: No  ADL Screening (condition at time of admission) Patient's cognitive ability adequate to safely complete daily activities?: Yes Is the patient deaf or have difficulty hearing?: No Does the patient have difficulty seeing, even when wearing glasses/contacts?: No Does the patient have difficulty concentrating, remembering, or making decisions?: No Patient able to express need for assistance with ADLs?: Yes Does the patient have difficulty dressing or bathing?: No Independently performs ADLs?: Yes (appropriate for developmental age) Does the patient have difficulty walking or climbing stairs?: Yes (difficulty with stairs) Weakness of Legs: None Weakness of Arms/Hands: None       Abuse/Neglect Assessment (Assessment to be complete while patient is alone) Physical Abuse: Denies Verbal Abuse: Denies Sexual Abuse: Denies Exploitation of patient/patient's resources:  Denies Self-Neglect: Denies Values / Beliefs Cultural Requests During Hospitalization: None Spiritual Requests During Hospitalization: Other (comment) (chaplain consult grief and support) Consults Spiritual Care Consult Needed: Yes (Comment) (Request chaplian for trauma/ grief support) Social Work Consult Needed: No Merchant navy officer (For Healthcare) Does Patient Have a Programmer, multimedia?: No Would patient like information on creating a medical advance directive?: No - Patient declined    Additional Information 1:1 In Past 12 Months?: No CIRT Risk: No Elopement Risk: No Does patient have medical clearance?: No     Disposition: Per Nira Conn meets inpatient criteria Disposition Initial Assessment Completed for this Encounter: Yes Disposition of Patient: Inpatient treatment program, Other dispositions (TBD) Type of inpatient treatment program: Adult  Hipolito Bayley 06/21/2016 10:09 PM

## 2016-06-21 NOTE — ED Provider Notes (Signed)
[facility]-EMERGENCY DEPT Provider Note   CSN: [id number] Arrival date & time: 06/21/16  1834     History   Chief Complaint Chief Complaint  Patient presents with  . Suicidal    HPI Jaime Leblanc is a 26 y.o. female.  Pt presents to the ED today with suicidal ideation.  She said that she has chronic pain which makes her very anxious.  She said she goes through periods of deep, dark depression and panic attacks which overwhelm her.  The pt said that she has medications for pain, but is afraid to take them because 2 friends have died from drug abuse.  The pt made superficial abrasions to her left forearm and to her face to try to hurt herself.  She said that she is very anxious right now.  She called the police who brought her here.      Past Medical History:  Diagnosis Date  . Anxiety   . Depression   . Fibromyalgia   . Headache   . HPV in female   . Neuropathy (HCC)    Left hand and in muscle all over the body  . OCD (obsessive compulsive disorder)   . Optic neuritis    Left Eye  . Panic disorder     Patient Active Problem List   Diagnosis Date Noted  . Thrush, oral 01/09/2016  . Neuropathy (HCC) 01/09/2016  . Fibromyalgia 02/14/2015  . OCD (obsessive compulsive disorder) 08/15/2014  . Frequent headaches 08/15/2014  . Anxiety 07/18/2014    Past Surgical History:  Procedure Laterality Date  . WISDOM TOOTH EXTRACTION      OB History    No data available       Home Medications    Prior to Admission medications   Medication Sig Start Date End Date Taking? Authorizing Provider  ALPRAZolam Prudy Feeler(XANAX) 1 MG tablet Take 1 tablet (1 mg total) by mouth 3 (three) times daily. 06/10/16  Yes Yvonne R Lowne Chase, DO  cyanocobalamin (,VITAMIN B-12,) 1000 MCG/ML injection Inject 1,000 mcg into the muscle once a week.  04/05/14  Yes Historical Provider, MD  diphenhydrAMINE (BENADRYL) 25 mg capsule Take 25 mg by mouth as needed.    Yes Historical Provider, MD  eletriptan (RELPAX)  40 MG tablet Take 1 tablet (40 mg total) by mouth as needed for migraine or headache. May repeat in 2 hours if headache persists or recurs. 06/06/16  Yes Yvonne R Lowne Chase, DO  hydrOXYzine (ATARAX/VISTARIL) 25 MG tablet Take 1 tablet (25 mg total) by mouth 3 (three) times daily as needed. 06/16/16  Yes Yvonne R Lowne Chase, DO  Lactobacillus-Inulin (CULTURELLE DIGESTIVE HEALTH PO) Take 1 tablet by mouth.   Yes Historical Provider, MD  Levonorgestrel-Ethinyl Estradiol (AMETHIA,CAMRESE) 0.15-0.03 &0.01 MG tablet Take 1 tablet by mouth daily. 03/28/16  Yes Historical Provider, MD  oxyCODONE-acetaminophen (PERCOCET) 10-325 MG tablet Take 1 tablet by mouth every 6 (six) hours as needed for pain. 06/11/16  Yes Historical Provider, MD  promethazine (PHENERGAN) 25 MG tablet Take 1 tablet (25 mg total) by mouth every 8 (eight) hours as needed for nausea or vomiting. 06/06/16  Yes Yvonne R Lowne Chase, DO  sertraline (ZOLOFT) 100 MG tablet Take 200 mg by mouth daily.   Yes Historical Provider, MD  traZODone (DESYREL) 50 MG tablet TAKE 1-2 TABLETS BY MOUTH AT BEDTIME 06/10/16  Yes Yvonne R Lowne Chase, DO  fluticasone (FLONASE) 50 MCG/ACT nasal spray Place 2 sprays into both nostrils daily. Patient not taking: Reported on  06/21/2016 06/06/16   Donato Schultz, DO    Family History Family History  Problem Relation Age of Onset  . Colon cancer Mother   . Hypertension Mother   . Depression Mother   . Anxiety disorder Mother   . Hypertension Father   . Gout Father     Social History Social History  Substance Use Topics  . Smoking status: Former Smoker    Quit date: 08/15/2010  . Smokeless tobacco: Not on file  . Alcohol use Yes     Comment: Maybe once a month     Allergies   Azithromycin; Lamotrigine; Codeine; Lidocaine; Menthol; and Topiramate   Review of Systems Review of Systems  Skin: Positive for wound.  Psychiatric/Behavioral: Positive for self-injury and suicidal ideas. The patient is  nervous/anxious.   All other systems reviewed and are negative.    Physical Exam Updated Vital Signs BP 108/81 (BP Location: Left Arm)   Pulse 87   Temp 97.8 F (36.6 C) (Oral)   Resp 20   SpO2 97%   Physical Exam  Constitutional: She is oriented to person, place, and time. She appears well-developed and well-nourished.  HENT:  Head: Normocephalic.    Right Ear: External ear normal.  Left Ear: External ear normal.  Nose: Nose normal.  Mouth/Throat: Oropharynx is clear and moist.  Eyes: Conjunctivae and EOM are normal. Pupils are equal, round, and reactive to light.  Neck: Normal range of motion. Neck supple.  Cardiovascular: Normal rate, regular rhythm, normal heart sounds and intact distal pulses.   Pulmonary/Chest: Effort normal and breath sounds normal.  Abdominal: Soft. Bowel sounds are normal.  Musculoskeletal: Normal range of motion.  Neurological: She is alert and oriented to person, place, and time.  Skin: Skin is warm.     Psychiatric: She has a normal mood and affect. Her behavior is normal. Judgment and thought content normal.  Nursing note and vitals reviewed.    ED Treatments / Results  Labs (all labs ordered are listed, but only abnormal results are displayed) Labs Reviewed  COMPREHENSIVE METABOLIC PANEL - Abnormal; Notable for the following:       Result Value   Total Protein 8.2 (*)    All other components within normal limits  CBC WITH DIFFERENTIAL/PLATELET - Abnormal; Notable for the following:    RBC 5.39 (*)    Hemoglobin 15.6 (*)    Platelets 435 (*)    All other components within normal limits  RAPID URINE DRUG SCREEN, HOSP PERFORMED - Abnormal; Notable for the following:    Benzodiazepines POSITIVE (*)    All other components within normal limits  ACETAMINOPHEN LEVEL - Abnormal; Notable for the following:    Acetaminophen (Tylenol), Serum <10 (*)    All other components within normal limits  ETHANOL  SALICYLATE LEVEL  URINALYSIS,  ROUTINE W REFLEX MICROSCOPIC  PREGNANCY, URINE    EKG  EKG Interpretation None       Radiology No results found.  Procedures Procedures (including critical care time)  Medications Ordered in ED Medications  LORazepam (ATIVAN) tablet 1 mg (not administered)  LORazepam (ATIVAN) tablet 1 mg (not administered)  acetaminophen (TYLENOL) tablet 650 mg (not administered)  ibuprofen (ADVIL,MOTRIN) tablet 600 mg (not administered)  ondansetron (ZOFRAN) tablet 4 mg (not administered)  alum & mag hydroxide-simeth (MAALOX/MYLANTA) 200-200-20 MG/5ML suspension 30 mL (not administered)  hydrOXYzine (ATARAX/VISTARIL) tablet 25 mg (not administered)  Levonorgestrel-Ethinyl Estradiol (AMETHIA,CAMRESE) 0.15-0.03 &0.01 MG tablet 1 tablet (not administered)  sertraline (ZOLOFT)  tablet 200 mg (not administered)  traZODone (DESYREL) tablet 50-100 mg (not administered)     Initial Impression / Assessment and Plan / ED Course  I have reviewed the triage vital signs and the nursing notes.  Pertinent labs & imaging results that were available during my care of the patient were reviewed by me and considered in my medical decision making (see chart for details).    Pt is medically clear.  She is stable for TTS consult.  Final Clinical Impressions(s) / ED Diagnoses   Final diagnoses:  Suicidal ideation  Abrasions of multiple sites    New Prescriptions New Prescriptions   No medications on file     Jacalyn Lefevre, MD 06/21/16 2050

## 2016-06-21 NOTE — ED Notes (Signed)
[facility] Expected date:  Expected time:  Means of arrival:  Comments: colemen

## 2016-06-22 DIAGNOSIS — Z800 Family history of malignant neoplasm of digestive organs: Secondary | ICD-10-CM

## 2016-06-22 DIAGNOSIS — Z818 Family history of other mental and behavioral disorders: Secondary | ICD-10-CM

## 2016-06-22 DIAGNOSIS — R45851 Suicidal ideations: Secondary | ICD-10-CM

## 2016-06-22 DIAGNOSIS — Z8249 Family history of ischemic heart disease and other diseases of the circulatory system: Secondary | ICD-10-CM

## 2016-06-22 DIAGNOSIS — Z87891 Personal history of nicotine dependence: Secondary | ICD-10-CM | POA: Diagnosis not present

## 2016-06-22 DIAGNOSIS — Z888 Allergy status to other drugs, medicaments and biological substances status: Secondary | ICD-10-CM | POA: Diagnosis not present

## 2016-06-22 DIAGNOSIS — F339 Major depressive disorder, recurrent, unspecified: Secondary | ICD-10-CM

## 2016-06-22 DIAGNOSIS — Z79899 Other long term (current) drug therapy: Secondary | ICD-10-CM

## 2016-06-22 MED ORDER — BUPROPION HCL ER (XL) 150 MG PO TB24
150.0000 mg | ORAL_TABLET | Freq: Every day | ORAL | Status: DC
Start: 2016-06-22 — End: 2016-06-25
  Administered 2016-06-22 – 2016-06-25 (×4): 150 mg via ORAL
  Filled 2016-06-22 (×4): qty 1

## 2016-06-22 MED ORDER — HYDROXYZINE HCL 25 MG PO TABS
50.0000 mg | ORAL_TABLET | Freq: Every day | ORAL | Status: DC
  Administered 2016-06-22 – 2016-06-24 (×3): 50 mg via ORAL
  Filled 2016-06-22 (×3): qty 2

## 2016-06-22 NOTE — Progress Notes (Signed)
CSW filed patient's examination paperwork into IVC logbook.  

## 2016-06-22 NOTE — Progress Notes (Signed)
CS[facility], [facility] [place], [facility], [facility].

## 2016-06-22 NOTE — Consult Note (Signed)
[facility] Consult   Reason for Consult:  Suicide attempt and depression Referring Physician:  EDP Patient Identification: Jaime Leblanc MRN:  [id number] Principal Diagnosis: Major depressive disorder, recurrent episode with anxious distress [facility]) Diagnosis:   Patient Active Problem List   Diagnosis Date Noted  . Major depressive disorder, recurrent episode with anxious distress ([place]) [F33.9] 06/22/2016    Priority: High  . OCD (obsessive compulsive disorder) [F42.9] 08/15/2014    Priority: High  . Thrush, oral [B37.0] 01/09/2016  . Neuropathy ([place]) [G62.9] 01/09/2016  . Fibromyalgia [M79.7] 02/14/2015  . Frequent headaches [R51] 08/15/2014  . Anxiety [F41.9] 07/18/2014    Total Time spent with patient: 45 minutes  Subjective:   Jaime Leblanc is a 26 y.o. female patient admitted after suicide attempt.  HPI:  Patient who reports history of OCD, MDD, Panic disorder and Chronic pain. Patient reports that she has been experiencing panic attacks, sleep problem due to Chronic pain. Patient reports worsening depression and recurrent suicidal thoughts. She made superficial abrasions to her left forearm and to her face to try to hurt herself and has thought about placing a garbage bag over head. Patient states that she just feels like she does not want to live anymore and tired of having chronic pain. Patient denies drugs and alcohol abuse.  Past Psychiatric History: as above  Risk to Self: Suicidal Ideation: Yes-Currently Present Suicidal Intent: Yes-Currently Present Is patient at risk for suicide?: Yes Suicidal Plan?: Yes-Currently Present Specify Current Suicidal Plan: cut wrist / put bag over head Access to Means: Yes Specify Access to Suicidal Means: access to sharps What has been your use of drugs/alcohol within the last 12 months?: none How many times?: 4 Other Self Harm Risks: cutting current Triggers for Past Attempts: Other (Comment) (pain) Intentional Self  Injurious Behavior: Cutting, Damaging Comment - Self Injurious Behavior: cutting/ scratched face and wrist Risk to Others: Homicidal Ideation: No Thoughts of Harm to Others: No Current Homicidal Intent: No Current Homicidal Plan: No Access to Homicidal Means: No Identified Victim: none History of harm to others?: No Assessment of Violence: None Noted Violent Behavior Description: n/a Does patient have access to weapons?: No Criminal Charges Pending?: No Does patient have a court date: No Prior Inpatient Therapy: Prior Inpatient Therapy: No Prior Therapy Dates: n/a Prior Therapy Facilty/Provider(s): n/a Reason for Treatment: n/a Prior Outpatient Therapy: Prior Outpatient Therapy: Yes Prior Therapy Dates: 2016 Prior Therapy Facilty/Provider(s): [facility] Reason for Treatment: depression/ chronic pain Does patient have an ACCT team?: No Does patient have Intensive In-House Services?  : No Does patient have [facility] services? : No Does patient have P4CC services?: No  Past Medical History:  Past Medical History:  Diagnosis Date  . Anxiety   . Depression   . Fibromyalgia   . Headache   . HPV in female   . Neuropathy ([place])    Left hand and in muscle all over the body  . OCD (obsessive compulsive disorder)   . Optic neuritis    Left Eye  . Panic disorder     Past Surgical History:  Procedure Laterality Date  . WISDOM TOOTH EXTRACTION     Family History:  Family History  Problem Relation Age of Onset  . Colon cancer Mother   . Hypertension Mother   . Depression Mother   . Anxiety disorder Mother   . Hypertension Father   . Gout Father    Family Psychiatric  History:  Social History:  History  Alcohol  Use  . Yes    Comment: Maybe once a month     History  Drug Use No    Social History   Social History  . Marital status: Single    Spouse name: N/A  . Number of children: N/A  . Years of education: N/A   Social History Main Topics  .  Smoking status: Former Smoker    Quit date: 08/15/2010  . Smokeless tobacco: None  . Alcohol use Yes     Comment: Maybe once a month  . Drug use: No  . Sexual activity: Not Currently   Other Topics Concern  . None   Social History Narrative  . None   Additional Social History:    Allergies:   Allergies  Allergen Reactions  . Azithromycin Itching  . Lamotrigine Rash  . Codeine Itching  . Lidocaine Other (See Comments)    Skin reaction  . Menthol Itching    Menthol in lotion-pt states this makes her skin feel like it is burning  . Topiramate Other (See Comments)    Leg pain    Labs:  Results for orders placed or performed during the hospital encounter of 06/21/16 (from the past 48 hour(s))  Comprehensive metabolic panel     Status: Abnormal   Collection Time: 06/21/16  7:41 PM  Result Value Ref Range   Sodium 139 135 - 145 mmol/L   Potassium 3.9 3.5 - 5.1 mmol/L   Chloride 107 101 - 111 mmol/L   CO2 23 22 - 32 mmol/L   Glucose, Bld 80 65 - 99 mg/dL   BUN 16 6 - 20 mg/dL   Creatinine, Ser 0.80 0.44 - 1.00 mg/dL   Calcium 9.5 8.9 - 10.3 mg/dL   Total Protein 8.2 (H) 6.5 - 8.1 g/dL   Albumin 4.9 3.5 - 5.0 g/dL   AST 24 15 - 41 U/L   ALT 28 14 - 54 U/L   Alkaline Phosphatase 70 38 - 126 U/L   Total Bilirubin 0.5 0.3 - 1.2 mg/dL   GFR calc non Af Amer >60 >60 mL/min   GFR calc Af Amer >60 >60 mL/min    Comment: (NOTE) The eGFR has been calculated using the CKD EPI equation. This calculation has not been validated in all clinical situations. eGFR's persistently <60 mL/min signify possible Chronic Kidney Disease.    Anion gap 9 5 - 15  Ethanol     Status: None   Collection Time: 06/21/16  7:41 PM  Result Value Ref Range   Alcohol, Ethyl (B) <5 <5 mg/dL    Comment:        LOWEST DETECTABLE LIMIT FOR SERUM ALCOHOL IS 5 mg/dL FOR MEDICAL PURPOSES ONLY   CBC with Diff     Status: Abnormal   Collection Time: 06/21/16  7:41 PM  Result Value Ref Range   WBC 10.2  4.0 - 10.5 K/uL   RBC 5.39 (H) 3.87 - 5.11 MIL/uL   Hemoglobin 15.6 (H) 12.0 - 15.0 g/dL   HCT 45.8 36.0 - 46.0 %   MCV 85.0 78.0 - 100.0 fL   MCH 28.9 26.0 - 34.0 pg   MCHC 34.1 30.0 - 36.0 g/dL   RDW 13.5 11.5 - 15.5 %   Platelets 435 (H) 150 - 400 K/uL   Neutrophils Relative % 60 %   Neutro Abs 6.2 1.7 - 7.7 K/uL   Lymphocytes Relative 30 %   Lymphs Abs 3.1 0.7 - 4.0 K/uL   Monocytes Relative  7 %   Monocytes Absolute 0.7 0.1 - 1.0 K/uL   Eosinophils Relative 2 %   Eosinophils Absolute 0.2 0.0 - 0.7 K/uL   Basophils Relative 1 %   Basophils Absolute 0.1 0.0 - 0.1 K/uL  Acetaminophen level     Status: Abnormal   Collection Time: 06/21/16  7:41 PM  Result Value Ref Range   Acetaminophen (Tylenol), Serum <10 (L) 10 - 30 ug/mL    Comment:        THERAPEUTIC CONCENTRATIONS VARY SIGNIFICANTLY. A RANGE OF 10-30 ug/mL MAY BE AN EFFECTIVE CONCENTRATION FOR MANY PATIENTS. HOWEVER, SOME ARE BEST TREATED AT CONCENTRATIONS OUTSIDE THIS RANGE. ACETAMINOPHEN CONCENTRATIONS >150 ug/mL AT 4 HOURS AFTER INGESTION AND >50 ug/mL AT 12 HOURS AFTER INGESTION ARE OFTEN ASSOCIATED WITH TOXIC REACTIONS.   Salicylate level     Status: None   Collection Time: 06/21/16  7:41 PM  Result Value Ref Range   Salicylate Lvl <2.7 2.8 - 30.0 mg/dL  Urine rapid drug screen (hosp performed)not at [facility]     Status: Abnormal   Collection Time: 06/21/16  8:21 PM  Result Value Ref Range   Opiates NONE DETECTED NONE DETECTED   Cocaine NONE DETECTED NONE DETECTED   Benzodiazepines POSITIVE (A) NONE DETECTED   Amphetamines NONE DETECTED NONE DETECTED   Tetrahydrocannabinol NONE DETECTED NONE DETECTED   Barbiturates NONE DETECTED NONE DETECTED    Comment:        DRUG SCREEN FOR MEDICAL PURPOSES ONLY.  IF CONFIRMATION IS NEEDED FOR ANY PURPOSE, NOTIFY LAB WITHIN 5 DAYS.        LOWEST DETECTABLE LIMITS FOR URINE DRUG SCREEN Drug Class       Cutoff (ng/mL) Amphetamine      1000 Barbiturate       200 Benzodiazepine   253 Tricyclics       664 Opiates          300 Cocaine          300 THC              50   Urinalysis, Routine w reflex microscopic     Status: None   Collection Time: 06/21/16  8:21 PM  Result Value Ref Range   Color, Urine YELLOW YELLOW   APPearance CLEAR CLEAR   Specific Gravity, Urine 1.019 1.005 - 1.030   pH 5.0 5.0 - 8.0   Glucose, UA NEGATIVE NEGATIVE mg/dL   Hgb urine dipstick NEGATIVE NEGATIVE   Bilirubin Urine NEGATIVE NEGATIVE   Ketones, ur NEGATIVE NEGATIVE mg/dL   Protein, ur NEGATIVE NEGATIVE mg/dL   Nitrite NEGATIVE NEGATIVE   Leukocytes, UA NEGATIVE NEGATIVE  Pregnancy, urine     Status: None   Collection Time: 06/21/16  8:21 PM  Result Value Ref Range   Preg Test, Ur NEGATIVE NEGATIVE    Comment:        THE SENSITIVITY OF THIS METHODOLOGY IS >20 mIU/mL.     Current Facility-Administered Medications  Medication Dose Route Frequency Provider Last Rate Last Dose  . acetaminophen (TYLENOL) tablet 650 mg  650 mg Oral Q4H PRN Isla Pence, MD      . alum & mag hydroxide-simeth (MAALOX/MYLANTA) 200-200-20 MG/5ML suspension 30 mL  30 mL Oral PRN Isla Pence, MD      . buPROPion (WELLBUTRIN XL) 24 hr tablet 150 mg  150 mg Oral Daily Denys Labree, MD      . HYDROcodone-acetaminophen (NORCO/VICODIN) 5-325 MG per tablet 1 tablet  1 tablet Oral Q6H PRN  Isla Pence, MD   1 tablet at 06/22/16 0522  . hydrOXYzine (ATARAX/VISTARIL) tablet 50 mg  50 mg Oral QHS Madilynn Montante, MD      . ibuprofen (ADVIL,MOTRIN) tablet 600 mg  600 mg Oral Q8H PRN Isla Pence, MD   600 mg at 06/22/16 0920  . Levonorgestrel-Ethinyl Estradiol (AMETHIA,CAMRESE) 0.15-0.03 &0.01 MG tablet 1 tablet  1 tablet Oral Daily Isla Pence, MD      . LORazepam (ATIVAN) tablet 1 mg  1 mg Oral Once Isla Pence, MD      . LORazepam (ATIVAN) tablet 1 mg  1 mg Oral Q8H PRN Isla Pence, MD   1 mg at 06/22/16 7473  . ondansetron (ZOFRAN) tablet 4 mg  4 mg Oral Q8H PRN Isla Pence, MD      . sertraline (ZOLOFT) tablet 200 mg  200 mg Oral Daily Isla Pence, MD   200 mg at 06/22/16 4037   Current Outpatient Prescriptions  Medication Sig Dispense Refill  . ALPRAZolam (XANAX) 1 MG tablet Take 1 tablet (1 mg total) by mouth 3 (three) times daily. 90 tablet 0  . cyanocobalamin (,VITAMIN B-12,) 1000 MCG/ML injection Inject 1,000 mcg into the muscle once a week.     . diphenhydrAMINE (BENADRYL) 25 mg capsule Take 25 mg by mouth as needed.     . eletriptan (RELPAX) 40 MG tablet Take 1 tablet (40 mg total) by mouth as needed for migraine or headache. May repeat in 2 hours if headache persists or recurs. 10 tablet 0  . hydrOXYzine (ATARAX/VISTARIL) 25 MG tablet Take 1 tablet (25 mg total) by mouth 3 (three) times daily as needed. 30 tablet 0  . Lactobacillus-Inulin (CULTURELLE DIGESTIVE HEALTH PO) Take 1 tablet by mouth.    . Levonorgestrel-Ethinyl Estradiol (AMETHIA,CAMRESE) 0.15-0.03 &0.01 MG tablet Take 1 tablet by mouth daily.  4  . oxyCODONE-acetaminophen (PERCOCET) 10-325 MG tablet Take 1 tablet by mouth every 6 (six) hours as needed for pain.  0  . promethazine (PHENERGAN) 25 MG tablet Take 1 tablet (25 mg total) by mouth every 8 (eight) hours as needed for nausea or vomiting. 20 tablet 0  . sertraline (ZOLOFT) 100 MG tablet Take 200 mg by mouth daily.    . traZODone (DESYREL) 50 MG tablet TAKE 1-2 TABLETS BY MOUTH AT BEDTIME 60 tablet 0  . fluticasone (FLONASE) 50 MCG/ACT nasal spray Place 2 sprays into both nostrils daily. (Patient not taking: Reported on 06/21/2016) 16 g 1    Musculoskeletal: Strength & Muscle Tone: within normal limits Gait & Station: normal Patient leans: N/A  Psychiatric Specialty Exam: Physical Exam  Psychiatric: Thought content normal. Her mood appears anxious. Her speech is delayed. She is slowed and withdrawn. Cognition and memory are normal. She expresses impulsivity. She exhibits a depressed mood.    Review of Systems   Constitutional: Positive for malaise/fatigue.  HENT: Negative.   Eyes: Negative.   Respiratory: Negative.   Cardiovascular: Negative.   Gastrointestinal: Negative.   Genitourinary: Negative.   Musculoskeletal: Positive for myalgias.  Skin: Negative.   Neurological: Negative.   Endo/Heme/Allergies: Negative.   Psychiatric/Behavioral: Positive for depression and suicidal ideas. The patient is nervous/anxious and has insomnia.     Blood pressure 110/64, pulse 98, temperature 98.1 F (36.7 C), temperature source Oral, resp. rate 20, SpO2 99 %.There is no height or weight on file to calculate BMI.  General Appearance: Casual  Eye Contact:  Minimal  Speech:  Clear and Coherent and Slow  Volume:  Decreased  Mood:  Anxious, Depressed, Dysphoric and Hopeless  Affect:  Constricted  Thought Process:  Coherent and Descriptions of Associations: Intact  Orientation:  Full (Time, Place, and Person)  Thought Content:  Logical  Suicidal Thoughts:  Yes.  with intent/plan  Homicidal Thoughts:  No  Memory:  Immediate;   Good Recent;   Good Remote;   Good  Judgement:  Poor  Insight:  Shallow  Psychomotor Activity:  Psychomotor Retardation  Concentration:  Concentration: Fair and Attention Span: Fair  Recall:  Good  Fund of Knowledge:  Good  Language:  Good  Akathisia:  No  Handed:  Right  AIMS (if indicated):     Assets:  Communication Skills Desire for Improvement Social Support Others:  family support  ADL's:  Intact  Cognition:  WNL  Sleep:   poor     Treatment Plan Summary: Daily contact with patient to assess and evaluate symptoms and progress in treatment and Medication management  Continue Zoloft 200 mg daily for depression/OCD/Panic disorder Start Hydroxyzine 50 mg qhs for anxiety/sleep. Start Wellbutrin XL 191m daily for depression.  Disposition: Recommend psychiatric Inpatient admission when medically cleared.  ACorena Pilgrim MD 06/22/2016 11:36 AM

## 2016-06-22 NOTE — ED Notes (Signed)
Pt spent the day in her room. Had visits from both her mother and her father. Pt presents in a flat depressed mood. Had complaints of unrelieved generalized pain. Pt was given PRN for pain with no relief. Also given anti-anxiety medications with good results. During pt's visit with her mom pt became very agitated crying, hyperventilating and inconsolable stating, "I just want to die! I just want this pain to end!" Pt also looked at her mother and said--"I want you to leave!" Mother was very upset as well. This nurse gave emotional comfort and support to both the pt and her mother. Advised pt that she is getting the help that she needs and that the present situation is only temporary. Encouraged pt keep reaching out for support. Pt was visited by the [place] this afternoon with good results. Pt refused Tylenol and Vistaril stating it doesn't help me so why bother to take it. Encouraged pt to take all meds prescribed. Pt continues to endorse SI but denied HI/AVH.

## 2016-06-22 NOTE — BH Assessment (Signed)
[facility] Assessment Progress Note  06/22/16: Patient meets inpatient criteria as appropriate bed placement is investigated. Patient will be considered for [address] female bed when available.        

## 2016-06-22 NOTE — ED Notes (Signed)
SBAR Report received from previous nurse. Pt received calm with mother laying in the bed with pt Mother educated the length of visits and visiting ours on the unit. Pt denies current HI and A/V H at this time, and appears otherwise stable. Pt endorses pain 10/10, anxiety, depression at this time. Pt reminded of camera surveillance, q 15 min rounds, and rules of the milieu. Will continue to assess.

## 2016-06-23 MED ORDER — LORAZEPAM 1 MG PO TABS
1.0000 mg | ORAL_TABLET | Freq: Four times a day (QID) | ORAL | Status: DC | PRN
Start: 2016-06-23 — End: 2016-06-24
  Administered 2016-06-23 (×3): 1 mg via ORAL
  Filled 2016-06-23 (×3): qty 1

## 2016-06-23 NOTE — ED Notes (Signed)
Provided pt with coloring pages and [place] pages per her request. She has been sitting in the day room, calmly, coloring and watching TV. Parents have visited and are appropriate.

## 2016-06-23 NOTE — Progress Notes (Signed)
Pt was sitting up in bed wrapped in her blankets when I arrived. Her mother was bedside but offered to excuse herself. Pt said she would not like for her mother to stay during the visit.  Pt was tearful throughout our visit.  She wasn't sobbing, but tearful. She said she has pain all the time in her back and legs. She said she has cysts on her ovaries. She said she is always in pain and it makes her depressed. She said her parents "don't get it" regarding her pain. Pt said she has a lot of anxiety. She said if she can't get her mother on the phone, she fears something has happened to her. She said her mother had cancer when she was 8 until she was 11 and she feared, and still fears, something would happen to her. She said she is tired of the anxiety, depression and pain. She said sometimes she just wants to kill herself. Pt and I discussed how life would be for her parents w/o her and how painful that would be for them. Pt said she knows she is a burden to her parents. We discussed the "burden" would be and is much easier than the loss or even the thought of losing her. Pt said she had not thought of it that way. Pt said she just wants relief. 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 She said she does not want to do anything now. She said she is religious and grew up [employer]. She was seeking spiritual answers as well as a desire for medical answers. We talked about healing through medicine and prayer and how they can work together. Pt asked for prayer and expressed gratitude for prayer and the visit. Please page if additional assistance is needed. Chaplain Marjory LiesPamela Carrington Holder, M.Div.   06/22/16 1620  Clinical Encounter Type  Visited With Patient

## 2016-06-23 NOTE — ED Notes (Signed)
Provided pt with snacks and warm blanket.

## 2016-06-23 NOTE — Progress Notes (Signed)
06/23/16 1405:  LRT went to pt room to offer activities.  Pt was lying down and bright but declined.  Jaime Leblanc, LRT/CTRS

## 2016-06-23 NOTE — ED Notes (Signed)
Introduced self to patient/family. Pt oriented to unit expectations.  Assessed pt for:  A) Anxiety &/or agitation: This morning pt spoke about wanting her pain medication and anxiety medication. She states that she has chronic pain issues. She refused the PRN medications that are ordered for pain because they do not help. She was calm and cooperative until her mother visited. Her mother stayed for about 45 minutes, and pt became increasingly anxious, culminating in a near panic attack, clinging to her mother. Her mother was informed that visitation was over, and reassured that we would care for her daughter. She said, "Somehow I don't think so." After she left, this writer took a small paper back to pt and had her breathe into it deeply and think calming thoughts. She was able to calm herself down. Spoke with pt as she described separation anxiety ever since her mother had colon cancer when she was a child. She is also fearful about the other pt's here and about what it will be like in a hospital. This writer stayed with pt and reassured her. After pt was calm and felt safe, this writer went to the waiting room and shared with her the things that we discussed. (Pt had signed consent for her parents to receive information.) Pt is currently cam. Informed her mother that she can visit again at 11:30.   S) Safety: Safety maintained with q-15-minute checks and hourly rounds by staff.  A) ADLs: .dbatade  P) Pick-Up (room cleanliness): Pt's room clean and free of clutter.

## 2016-06-23 NOTE — ED Notes (Signed)
Spoke with TTS to get an ideal for pt [facility] because she and her family are anxious to know what the plan is. Per advise from TTS the department will now know for sure until around 4 pm when [facility]. Informed pt of this so that she would know going forward today what to expect and not be in the position of not knowing.

## 2016-06-24 MED ORDER — MELOXICAM 7.5 MG PO TABS
7.5000 mg | ORAL_TABLET | Freq: Every day | ORAL | Status: DC
  Administered 2016-06-25: 7.5 mg via ORAL
  Filled 2016-06-24 (×2): qty 1

## 2016-06-24 MED ORDER — HYDROXYZINE HCL 25 MG PO TABS
50.0000 mg | ORAL_TABLET | Freq: Three times a day (TID) | ORAL | Status: DC | PRN
  Administered 2016-06-24: 50 mg via ORAL
  Filled 2016-06-24: qty 2

## 2016-06-24 MED ORDER — HYDROXYZINE HCL 10 MG PO TABS
10.0000 mg | ORAL_TABLET | Freq: Two times a day (BID) | ORAL | Status: DC
  Administered 2016-06-24 – 2016-06-25 (×2): 10 mg via ORAL
  Filled 2016-06-24 (×3): qty 1

## 2016-06-24 NOTE — BH Assessment (Signed)
Reassessment:  Writer met with patient to complete a reassessment on this day. Patient's mom was at bedside per patient's request during the reassessment. Patient presented to [facility]/21/2016 with thoughts of suicidal ideations. She reported chronic pain which makes her anxious. Patient stated on that day that she has periods of "deep, dark depression, and panic attacks which overwhelm her". The pt made superficial abrasions to her left forearm and to her face to try to hurt herself.  Today patient denies suicidal ideations. She feels that she is able to contract for safety. Sts that she feels safe in the ER and doesn't want to hurt herself anymore. Patient however continues to have thoughts of sadness. She also admits that she "over worries". She is currently worried about not getting a INPT admission bed.  Patient is dressed in hospital gown and is alert and oriented x4. Patient speech was within normal limits and motor behavior appeared normal. Patient thought process is coherent. Patient does not appear to be responding to internal stimuli. Patient was cooperative throughout the assessment and states that she is agreeable to inpatient psychiatric treatment.  Writer discussed clinicals with Dr. Akintayo and Jamison Lord, DNP. Patient continues to meet criteria for INPT treatment. TTS to seek placement.  

## 2016-06-24 NOTE — ED Notes (Signed)
Pt reports feeling increasing anxiety due to pain increasing.

## 2016-06-24 NOTE — ED Notes (Signed)
Patient has been in her room most of the day, comes out occasionally.  Complains some of pain, but more so when her mother is present.  Also complains of anxiety when they are present.  She cries a great deal when they are here as well.  When parents leave she is up talking on the phone or sitting quietly in her room reading or watching television.  Her appetite is fair.  She has requested all day for an inpatient admission and when I told her she was going to [facility] she became hysterical (Mom was in the room at the time).  I was able to calm the patient down and answer questions about the facility for them.  She has calmed significantly since then and states she is anxious to get help.

## 2016-06-24 NOTE — Progress Notes (Signed)
06/24/16 1351:  LRT went to pt room to offer activities.  Pt stated she was going to take a nap instead of doing activities.  Caroll RancherMarjette Henri Guedes, LRT/CTRS

## 2016-06-24 NOTE — B[facility] Counselor (Signed)
[facility] Assessment Progress Note  Jaime Leblanc at [facility] called to accept pt. Accepting provider is Dr. Estill Cottahomas Cornwall. Call report to [phone]. Pt can come anytime tomorrow, 06/25/2016, after 9am. Pt's RN, Jaime Leblanc, notified.   Jaime ShockSamantha M. Ladona Ridgelaylor, MS, NCC, LPCA Counselor

## 2016-06-24 NOTE — BH Assessment (Signed)
[facility] Assessment Progress Note  Per Thedore MinsMojeed Akintayo, MD, this pt requires psychiatric hospitalization at this time.  The following facilities have been contacted to seek placement for this pt, with results as noted:  Beds available, information sent, decision pending:  [facility]   [facility]:  [facility]   Doylene Can[facility]omas Kelsei Defino, [place] Triage Specialist [phone]

## 2016-06-25 NOTE — ED Notes (Signed)
Patient denies SI, HI and AVH at this time. Plan of care discussed with patient. Encouragement and support provided and safety maintain. Q 15 min safety checks remain in place.

## 2016-06-25 NOTE — ED Notes (Signed)
Reported to this Clinical research associatewriter by Richardo Hanksawnly Dax, RN that patient can be [facility] after 9 am on 06/25/16.

## 2016-07-16 ENCOUNTER — Other Ambulatory Visit: Payer: Self-pay | Admitting: Family Medicine

## 2016-07-16 DIAGNOSIS — R110 Nausea: Secondary | ICD-10-CM

## 2016-07-16 DIAGNOSIS — Z8669 Personal history of other diseases of the nervous system and sense organs: Secondary | ICD-10-CM

## 2016-07-16 NOTE — Telephone Encounter (Signed)
Relation to WG:NFAOpt:self Call back number:[phone] Pharmacy: [facility] [address], [place], [place] [id number] [phone]    Reason for call:  Patient requesting a refill promethazine (PHENERGAN) 25 MG tablet and eletriptan (RELPAX) 40 MG tablet

## 2016-07-17 MED ORDER — PROMETHAZINE HCL 25 MG PO TABS: 25.0000 mg | ORAL_TABLET | Freq: Three times a day (TID) | ORAL | 0 refills | Status: DC | PRN

## 2016-07-17 MED ORDER — ELETRIPTAN HYDROBROMIDE 40 MG PO TABS: 40.0000 mg | ORAL_TABLET | ORAL | 0 refills | Status: DC | PRN

## 2016-07-17 NOTE — Telephone Encounter (Signed)
Refill done as requested Called the patient left detailed message refill done.

## 2016-07-18 NOTE — Telephone Encounter (Signed)
Please close this encounter

## 2016-07-21 ENCOUNTER — Encounter: Payer: Self-pay | Admitting: Family Medicine

## 2016-07-21 ENCOUNTER — Telehealth: Payer: Self-pay | Admitting: Family Medicine

## 2016-07-21 NOTE — Telephone Encounter (Signed)
Relation to ZO:XWRUpt:self Call back number: [phone]    Reason for call:  Patient wanted to inform PCP she stopped going to pain management due to medication suggested by the specialist, patient states the dosing was to high, patient scheduled a follow up for 07/29/16 with PCP. Patient requesting a refill  oxyCODONE-acetaminophen (PERCOCET) 10-325 MG tablet, please advise

## 2016-07-21 NOTE — Telephone Encounter (Signed)
We really prefer not to do pain management --- it is really difficult to get into different ones as well.  Did she not discuss dose with the provider?

## 2016-07-21 NOTE — Telephone Encounter (Signed)
Requesting:---Oxycodone Contract----01/07/2016 UDS----low risk, 07/10/2015 Last OV---01/08/2016----next scheduled appt is on 07/29/2016 Last Refill  Please Advise

## 2016-07-22 NOTE — Telephone Encounter (Signed)
Patient was informed of PCP instructions and she stated she has appt. With her in March and can discuss then.

## 2016-07-29 ENCOUNTER — Ambulatory Visit: Payer: Self-pay | Admitting: Family Medicine

## 2016-07-29 ENCOUNTER — Encounter: Payer: Self-pay | Admitting: Family Medicine

## 2016-07-29 DIAGNOSIS — Z0289 Encounter for other administrative examinations: Secondary | ICD-10-CM

## 2016-08-06 ENCOUNTER — Other Ambulatory Visit: Payer: Self-pay | Admitting: Family Medicine

## 2016-08-06 ENCOUNTER — Encounter: Payer: Self-pay | Admitting: Family Medicine

## 2016-08-06 DIAGNOSIS — L299 Pruritus, unspecified: Secondary | ICD-10-CM

## 2016-08-06 DIAGNOSIS — R110 Nausea: Secondary | ICD-10-CM

## 2016-08-06 DIAGNOSIS — Z8669 Personal history of other diseases of the nervous system and sense organs: Secondary | ICD-10-CM

## 2016-08-07 ENCOUNTER — Other Ambulatory Visit: Payer: Self-pay | Admitting: Family Medicine

## 2016-08-07 DIAGNOSIS — Z8669 Personal history of other diseases of the nervous system and sense organs: Secondary | ICD-10-CM

## 2016-08-07 MED ORDER — ELETRIPTAN HYDROBROMIDE 40 MG PO TABS: 40.0000 mg | ORAL_TABLET | ORAL | 0 refills | Status: DC | PRN

## 2016-08-07 MED ORDER — ALPRAZOLAM 1 MG PO TABS: 1.0000 mg | ORAL_TABLET | Freq: Three times a day (TID) | ORAL | 0 refills | Status: DC

## 2016-08-07 NOTE — Telephone Encounter (Signed)
Refill 1 month Needs ov

## 2016-08-11 ENCOUNTER — Other Ambulatory Visit: Payer: Self-pay | Admitting: Family Medicine

## 2016-08-11 DIAGNOSIS — L299 Pruritus, unspecified: Secondary | ICD-10-CM

## 2016-08-11 DIAGNOSIS — Z8669 Personal history of other diseases of the nervous system and sense organs: Secondary | ICD-10-CM

## 2016-08-11 DIAGNOSIS — R110 Nausea: Secondary | ICD-10-CM

## 2016-08-11 MED ORDER — HYDROXYZINE HCL 25 MG PO TABS: 25.0000 mg | ORAL_TABLET | Freq: Three times a day (TID) | ORAL | 0 refills | Status: DC | PRN

## 2016-08-11 MED ORDER — PROMETHAZINE HCL 25 MG PO TABS: 25.0000 mg | ORAL_TABLET | Freq: Three times a day (TID) | ORAL | 2 refills | Status: DC | PRN

## 2016-08-11 MED ORDER — HYDROXYZINE HCL 25 MG PO TABS: 25.0000 mg | ORAL_TABLET | Freq: Three times a day (TID) | ORAL | 5 refills | Status: DC | PRN

## 2016-08-11 MED ORDER — PROMETHAZINE HCL 25 MG PO TABS: 25.0000 mg | ORAL_TABLET | Freq: Three times a day (TID) | ORAL | 0 refills | Status: DC | PRN

## 2016-08-11 MED ORDER — ALPRAZOLAM 1 MG PO TABS: 1.0000 mg | ORAL_TABLET | Freq: Three times a day (TID) | ORAL | 2 refills | Status: DC

## 2016-08-11 MED ORDER — ELETRIPTAN HYDROBROMIDE 40 MG PO TABS: 40.0000 mg | ORAL_TABLET | ORAL | 11 refills | Status: DC | PRN

## 2016-08-11 NOTE — Telephone Encounter (Signed)
Prescriptions per mychart request done.

## 2016-09-10 ENCOUNTER — Encounter: Payer: Self-pay | Admitting: Family Medicine

## 2017-01-02 ENCOUNTER — Telehealth: Payer: Self-pay | Admitting: Family Medicine

## 2017-01-02 DIAGNOSIS — G629 Polyneuropathy, unspecified: Secondary | ICD-10-CM

## 2017-01-02 DIAGNOSIS — N809 Endometriosis, unspecified: Secondary | ICD-10-CM

## 2017-01-02 MED ORDER — ALPRAZOLAM 1 MG PO TABS: 1.0000 mg | ORAL_TABLET | Freq: Three times a day (TID) | ORAL | 0 refills | Status: DC

## 2017-01-02 NOTE — Telephone Encounter (Signed)
Pt called in to [place]  Pt says that she would like to have it set up at [facility]    ALSO,   Pt would like a Refill for Alprazolam

## 2017-01-02 NOTE — Telephone Encounter (Signed)
rx called into [facility] [address] and patient notified.

## 2017-01-02 NOTE — Telephone Encounter (Signed)
Okay #30, further refills per PCP.  Patient has moved out of town

## 2017-01-02 NOTE — Telephone Encounter (Signed)
Referral placed.  Patient requests refill for alprazolam   Last written: 08/11/16 #90 2rf Last ov: 02/28/16 Next ov: nothing scheduled Contract: 01/07/16 UDS: due but [place]   Called patient to ask when she is planning to follow up because we cannot keep filling rxs with out office visit.  She has an appointment on 01/16/17 with a primary care.

## 2017-04-14 IMAGING — DX DG CHEST 2V
2 series · 2 of 2 positions shown · non-contrast
Comparison: No prior.

CLINICAL DATA: Swelling.  Runny nose.  Joint pain.  Sinus pain

EXAM:
CHEST  2 VIEW

[chest pa]
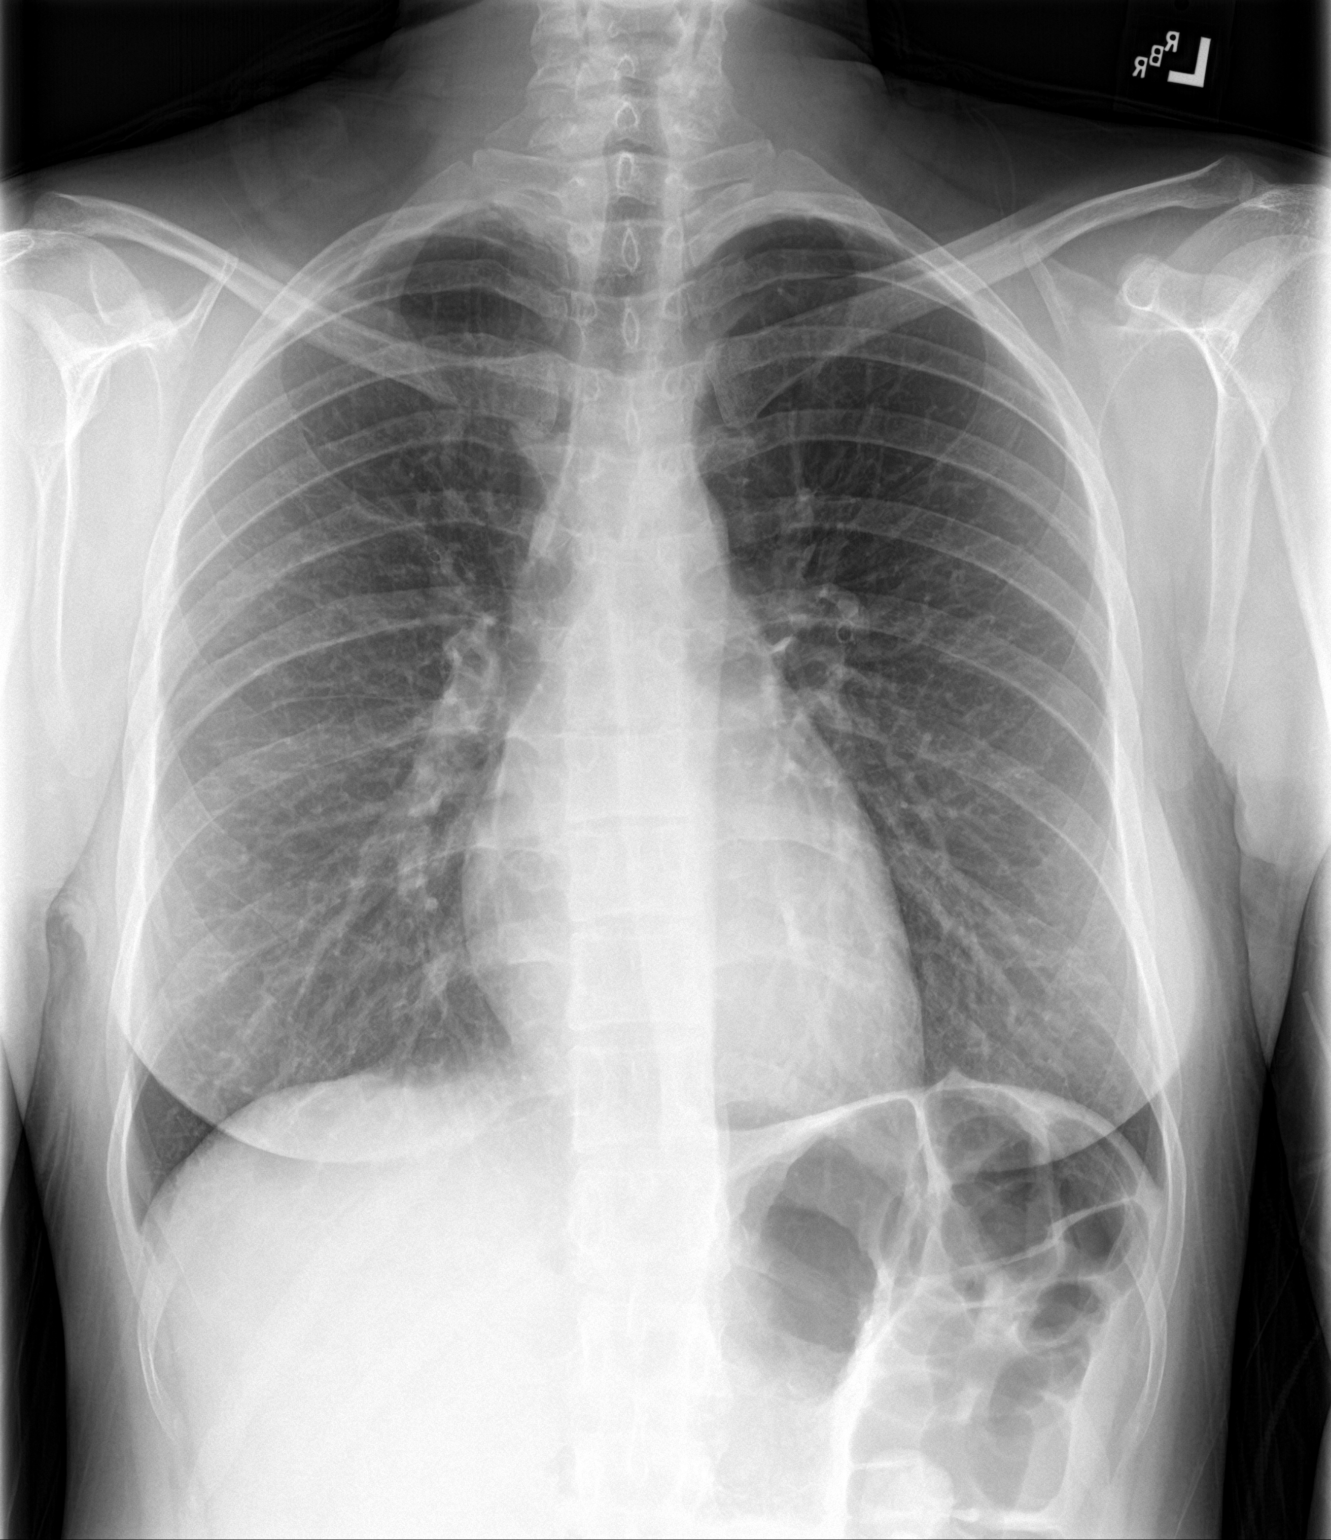

[chest lat]
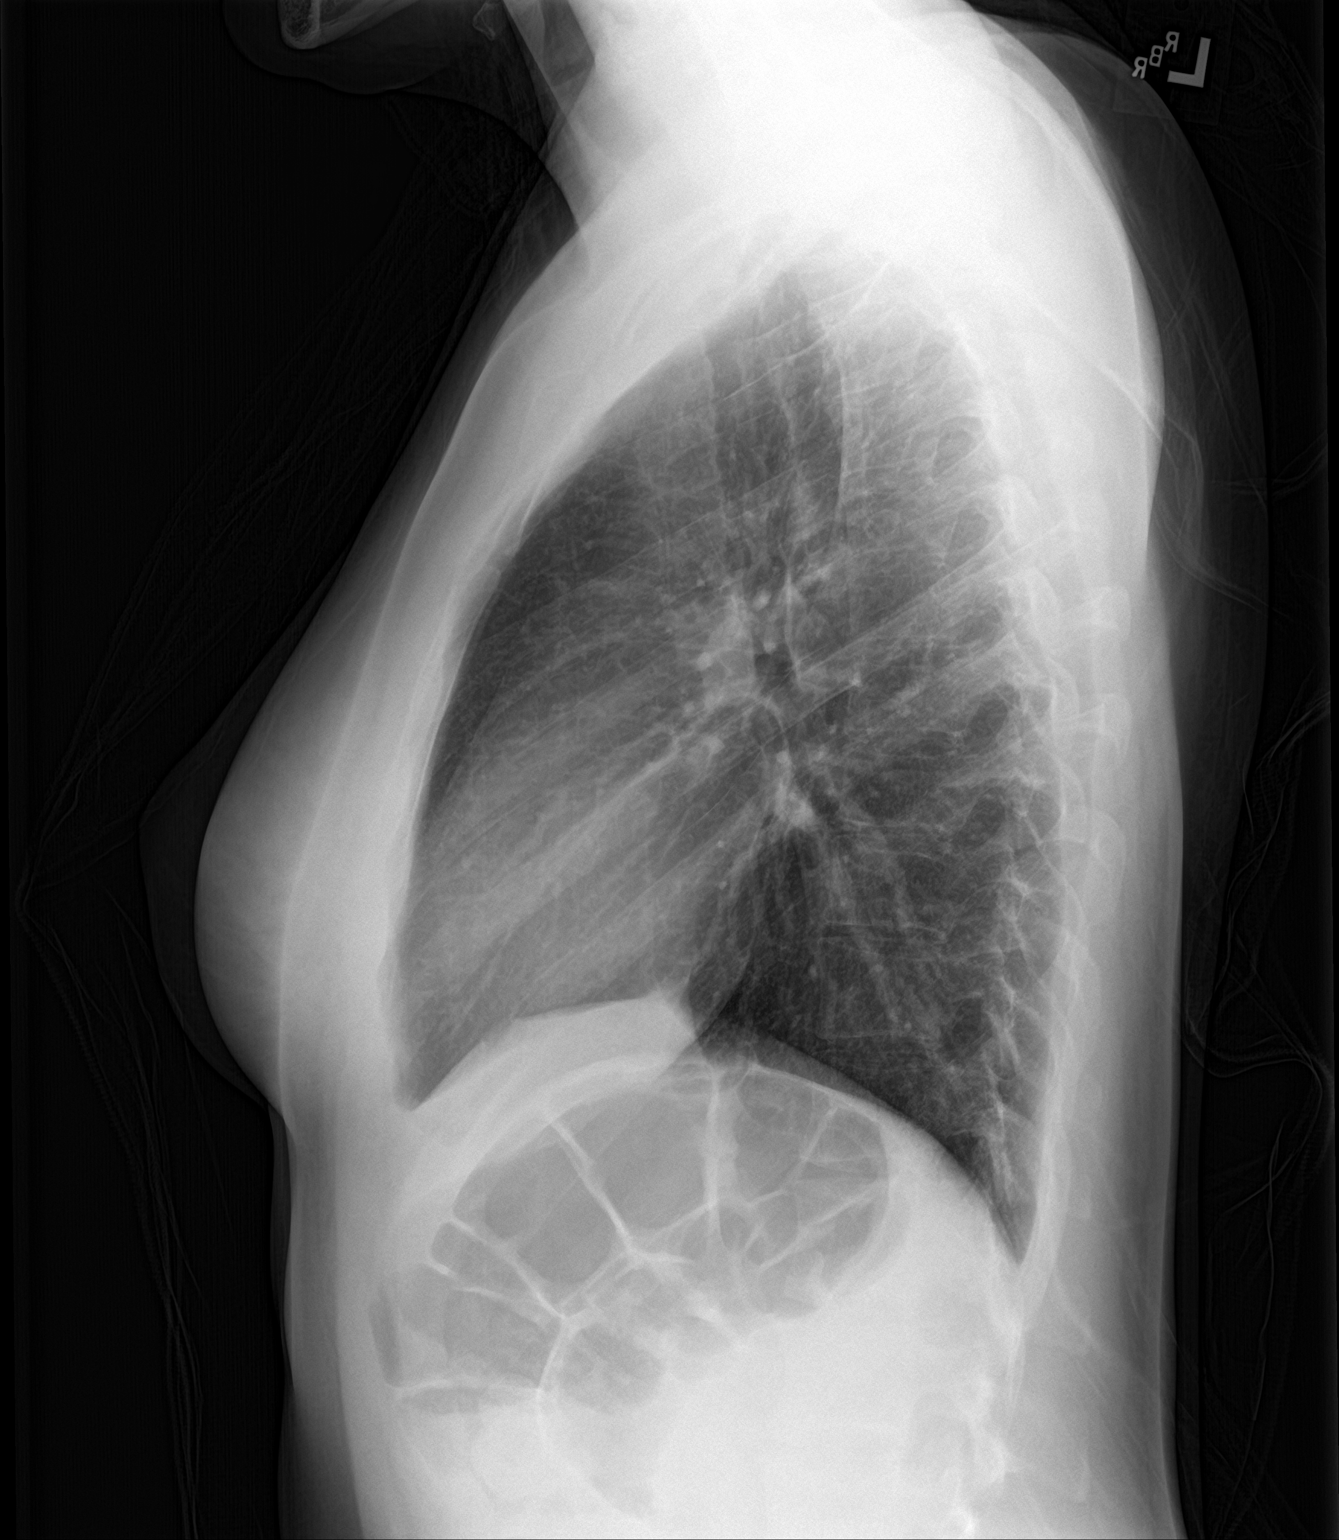

[2 of 2 positions shown; findings below may reference images not displayed]

FINDINGS: The heart size and mediastinal contours are within normal limits.
Both lungs are clear. The visualized skeletal structures are
unremarkable.
IMPRESSION: No active cardiopulmonary disease.

## 2017-06-02 DIAGNOSIS — N809 Endometriosis, unspecified: Secondary | ICD-10-CM

## 2017-06-02 HISTORY — DX: Endometriosis, unspecified: N80.9

## 2017-06-02 HISTORY — PX: LAPAROSCOPY: SHX197

## 2017-09-30 DIAGNOSIS — R102 Pelvic and perineal pain unspecified side: Secondary | ICD-10-CM | POA: Insufficient documentation

## 2017-09-30 DIAGNOSIS — N809 Endometriosis, unspecified: Secondary | ICD-10-CM | POA: Insufficient documentation

## 2019-05-24 ENCOUNTER — Ambulatory Visit
Admission: EM | Admit: 2019-05-24 | Discharge: 2019-05-24 | Disposition: A | Discharge status: Home / Self Care | Payer: BC Managed Care – PPO

## 2019-05-24 DIAGNOSIS — R1031 Right lower quadrant pain: Secondary | ICD-10-CM

## 2019-05-24 DIAGNOSIS — Z8742 Personal history of other diseases of the female genital tract: Secondary | ICD-10-CM | POA: Diagnosis not present

## 2019-05-24 NOTE — ED Provider Notes (Signed)
[name]    CSN: [id number] Arrival date & time: 05/24/19  1521      History   Chief Complaint Chief Complaint  Patient presents with  . Abdominal Pain    HPI Jaime Leblanc is a 28 y.o. female.   28 year old female with history of PCOS comes in for 2-day history of RLQ pain.  Patient states given history of PCOS, has had rupture of ovarian cyst in the past, and this feels similar.  Pain to the area has significantly improved since last night.  She has nausea without vomiting.  Denies fever, chills, body aches.  Denies urinary symptoms such as frequency, dysuria, hematuria.  Denies vaginal discharge, itching.  States she is taking ibuprofen with significant relief of symptoms. However, given current symptoms, had to miss work and therefore came in for evaluation.      Past Medical History:  Diagnosis Date  . Anxiety   . Depression   . Fibromyalgia   . Headache   . HPV in female   . Neuropathy    Left hand and in muscle all over the body  . OCD (obsessive compulsive disorder)   . Optic neuritis    Left Eye  . Panic disorder     Patient Active Problem List   Diagnosis Date Noted  . Major depressive disorder, recurrent episode with anxious distress (HCC) 06/22/2016  . Thrush, oral 01/09/2016  . Neuropathy 01/09/2016  . Fibromyalgia 02/14/2015  . OCD (obsessive compulsive disorder) 08/15/2014  . Frequent headaches 08/15/2014  . Anxiety 07/18/2014    Past Surgical History:  Procedure Laterality Date  . WISDOM TOOTH EXTRACTION      OB History   No obstetric history on file.      Home Medications    Prior to Admission medications   Medication Sig Start Date End Date Taking? Authorizing Provider  FLUoxetine (PROZAC) 40 MG capsule Take 60 mg by mouth daily.   Yes [provider]  ibuprofen (ADVIL) 800 MG tablet Take 800 mg by mouth every 8 (eight) hours as needed.   Yes [provider]  propranolol (INDERAL) 20 MG tablet Take  20 mg by mouth 3 (three) times daily.   Yes [provider]  eletriptan (RELPAX) 40 MG tablet Take 1 tablet (40 mg total) by mouth as needed for migraine or headache. May repeat in 2 hours if headache persists or recurs. 08/11/16   Donato Schultz, DO    Family History Family History  Problem Relation Age of Onset  . Colon cancer Mother   . Hypertension Mother   . Depression Mother   . Anxiety disorder Mother   . Hypertension Father   . Gout Father     Social History Social History   Tobacco Use  . Smoking status: Former Smoker    Quit date: 08/15/2010    Years since quitting: 8.7  . Smokeless tobacco: Current User  Substance Use Topics  . Alcohol use: Not Currently    Comment: Maybe once a month  . Drug use: No     Allergies   Azithromycin, Lamotrigine, Codeine, Lidocaine, Menthol, and Topiramate   Review of Systems Review of Systems  Reason unable to perform ROS: See HPI as above.     Physical Exam Triage Vital Signs ED Triage Vitals [05/24/19 1543]  Enc Vitals Group     BP 105/74     Pulse Rate 72     Resp 18     Temp 97.9  F (36.6 C)     Temp Source Oral     SpO2 97 %     Weight      Height      Head Circumference      Peak Flow      Pain Score 0     Pain Loc      Pain Edu?      Excl. in [place]?    No data found.  Updated Vital Signs BP 105/74 (BP Location: Left Arm)   Pulse 72   Temp 97.9 F (36.6 C) (Oral)   Resp 18   SpO2 97%   Physical Exam Constitutional:      General: She is not in acute distress.    Appearance: She is well-developed. She is not ill-appearing, toxic-appearing or diaphoretic.  HENT:     Head: Normocephalic and atraumatic.  Eyes:     Conjunctiva/sclera: Conjunctivae normal.     Pupils: Pupils are equal, round, and reactive to light.  Cardiovascular:     Rate and Rhythm: Normal rate and regular rhythm.     Heart sounds: Normal heart sounds. No murmur. No friction rub. No gallop.   Pulmonary:      Effort: Pulmonary effort is normal.     Breath sounds: Normal breath sounds. No wheezing or rales.  Abdominal:     General: Bowel sounds are normal.     Palpations: Abdomen is soft.     Tenderness: There is no abdominal tenderness. There is no right CVA tenderness, left CVA tenderness, guarding or rebound. Negative signs include Murphy's sign, Rovsing's sign, McBurney's sign, psoas sign and obturator sign.  Skin:    General: Skin is warm and dry.  Neurological:     Mental Status: She is alert and oriented to person, place, and time.  Psychiatric:        Behavior: Behavior normal.        Judgment: Judgment normal.      UC Treatments / Results  Labs (all labs ordered are listed, but only abnormal results are displayed) Labs Reviewed - No data to display  EKG   Radiology No results found.  Procedures Procedures (including critical care time)  Medications Ordered in UC Medications - No data to display  Initial Impression / Assessment and Plan / UC Course  I have reviewed the triage vital signs and the nursing notes.  Pertinent labs & imaging results that were available during my care of the patient were reviewed by me and considered in my medical decision making (see chart for details).    Abdomen soft, nontender to palpation at this time.  Discussed cannot rule out appendicitis due to right lower quadrant pain, but currently low suspicion.  Will have patient continue to monitor closely, strict return precautions given.  Otherwise, patient to follow-up with PCP/OB/GYN for further evaluation and management of PCOS.  Patient expresses understanding and agrees to plan.  Final Clinical Impressions(s) / UC Diagnoses   Final diagnoses:  Abdominal pain, RLQ   ED Prescriptions    None     PDMP not reviewed this encounter.   Ok Edwards, PA-C 05/24/19 1807

## 2019-05-24 NOTE — Discharge Instructions (Signed)
No alarming signs on exam. Continue to monitor, if RLQ worsening, having nausea/vomiting, fever, go to the ED for further evaluation needed.   Ibuprofen 800mg  three times a day, or 600mg  four times a day.

## 2019-05-24 NOTE — ED Triage Notes (Signed)
Pt states hx of PCOS, c/o RLQ pain last night with small bleeding. States better today, just sore. States needs a work note

## 2019-06-15 ENCOUNTER — Ambulatory Visit
Admission: EM | Admit: 2019-06-15 | Discharge: 2019-06-15 | Disposition: A | Discharge status: Home / Self Care | Payer: BC Managed Care – PPO | Attending: Physician Assistant | Admitting: Physician Assistant

## 2019-06-15 ENCOUNTER — Other Ambulatory Visit: Payer: Self-pay

## 2019-06-15 DIAGNOSIS — R430 Anosmia: Secondary | ICD-10-CM | POA: Diagnosis not present

## 2019-06-15 DIAGNOSIS — R0602 Shortness of breath: Secondary | ICD-10-CM

## 2019-06-15 DIAGNOSIS — R432 Parageusia: Secondary | ICD-10-CM | POA: Diagnosis not present

## 2019-06-15 DIAGNOSIS — Z20822 Contact with and (suspected) exposure to covid-19: Secondary | ICD-10-CM | POA: Diagnosis not present

## 2019-06-15 HISTORY — DX: Opioid abuse, in remission: F11.11

## 2019-06-15 MED ORDER — ALBUTEROL SULFATE HFA 108 (90 BASE) MCG/ACT IN AERS: 1.0000 | INHALATION_SPRAY | Freq: Four times a day (QID) | RESPIRATORY_TRACT | 0 refills | Status: DC | PRN

## 2019-06-15 MED ORDER — PROPRANOLOL HCL 20 MG PO TABS: 30.0000 mg | ORAL_TABLET | Freq: Three times a day (TID) | ORAL | 0 refills | Status: DC

## 2019-06-15 NOTE — ED Triage Notes (Signed)
Pt c/o SOB "feels like a bowling ball on my chest" x3-4 days. Pt in no distress speaking in complete sentences. Pt c/o headaches and nausea x 3 days, cough and loss of tastes and smell yesterday.

## 2019-06-15 NOTE — Discharge Instructions (Signed)
COVID PCR testing ordered. I would like you to quarantine until testing results. Albuterol for chest tightness/wheezing. You can take over the counter flonase/nasacort to help with nasal congestion/drainage. Tylenol/motrin for pain and fever. Keep hydrated, urine should be clear to pale yellow in color. If experiencing shortness of breath, trouble breathing, go to the emergency department for further evaluation needed.

## 2019-06-15 NOTE — ED Provider Notes (Signed)
[name]    CSN: [id number] Arrival date & time: 06/15/19  0932      History   Chief Complaint Chief Complaint  Patient presents with  . Shortness of Breath    HPI Jaime Leblanc is a 29 y.o. female.   29 year old female comes in for 3-4 day history of shortness of breath. Describes it as "bowling ball on my chest", with chest tightness and wheezing. States it feels congestion, but without productive cough. Also feels that the "ball to the chest" radiates from the epigastric region. Has had nausea, body aches, headache. Now with cough and loss of taste/smell. Able to tolerate oral intake. Chills with night sweats. Denies fever. Denies hemoptysis.  Denies abdominal pain, diarrhea. No obvious sick contact. Former smoker. Ibuprofen, nexium, peptobismol with some improvement.     Past Medical History:  Diagnosis Date  . Anxiety   . Depression   . Fibromyalgia   . Headache   . HPV in female   . Narcotic abuse in remission ([place])   . Neuropathy    Left hand and in muscle all over the body  . OCD (obsessive compulsive disorder)   . Optic neuritis    Left Eye  . Panic disorder     Patient Active Problem List   Diagnosis Date Noted  . Major depressive disorder, recurrent episode with anxious distress ([place]) 06/22/2016  . Thrush, oral 01/09/2016  . Neuropathy 01/09/2016  . Fibromyalgia 02/14/2015  . OCD (obsessive compulsive disorder) 08/15/2014  . Frequent headaches 08/15/2014  . Anxiety 07/18/2014    Past Surgical History:  Procedure Laterality Date  . WISDOM TOOTH EXTRACTION      OB History   No obstetric history on file.      Home Medications    Prior to Admission medications   Medication Sig Start Date End Date Taking? Authorizing Provider  albuterol (VENTOLIN HFA) 108 (90 Base) MCG/ACT inhaler Inhale 1-2 puffs into the lungs every 6 (six) hours as needed for wheezing or shortness of breath. 06/15/19   Tasia Catchings, Jakobi Thetford V, PA-C  eletriptan (RELPAX) 40  MG tablet Take 1 tablet (40 mg total) by mouth as needed for migraine or headache. May repeat in 2 hours if headache persists or recurs. 08/11/16   Ann Held, DO  FLUoxetine (PROZAC) 40 MG capsule Take 60 mg by mouth daily.    [provider]  ibuprofen (ADVIL) 800 MG tablet Take 800 mg by mouth every 8 (eight) hours as needed.    [provider]  propranolol (INDERAL) 20 MG tablet Take 1.5 tablets (30 mg total) by mouth 3 (three) times daily. 06/15/19 07/15/19  Ok Edwards, PA-C    Family History Family History  Problem Relation Age of Onset  . Colon cancer Mother   . Hypertension Mother   . Depression Mother   . Anxiety disorder Mother   . Hypertension Father   . Gout Father     Social History Social History   Tobacco Use  . Smoking status: Former Smoker    Quit date: 08/15/2010    Years since quitting: 8.8  . Smokeless tobacco: Current User  Substance Use Topics  . Alcohol use: Not Currently    Comment: Maybe once a month  . Drug use: No     Allergies   Azithromycin, Lamotrigine, Codeine, Lidocaine, Menthol, and Topiramate   Review of Systems Review of Systems  Reason unable to perform ROS: See HPI as above.  Physical Exam Triage Vital Signs ED Triage Vitals [06/15/19 0953]  Enc Vitals Group     BP 126/86     Pulse Rate 60     Resp 18     Temp 97.9 F (36.6 C)     Temp Source Oral     SpO2 98 %     Weight      Height      Head Circumference      Peak Flow      Pain Score 0     Pain Loc      Pain Edu?      Excl. in [place]?    No data found.  Updated Vital Signs BP 126/86 (BP Location: Left Arm)   Pulse 60   Temp 97.9 F (36.6 C) (Oral)   Resp 18   SpO2 98%   Physical Exam Constitutional:      General: She is not in acute distress.    Appearance: Normal appearance. She is not ill-appearing, toxic-appearing or diaphoretic.  HENT:     Head: Normocephalic and atraumatic.     Mouth/Throat:     Mouth: Mucous membranes  are moist.     Pharynx: Oropharynx is clear. Uvula midline.  Cardiovascular:     Rate and Rhythm: Normal rate and regular rhythm.     Heart sounds: Normal heart sounds. No murmur. No friction rub. No gallop.   Pulmonary:     Effort: Pulmonary effort is normal. No accessory muscle usage, prolonged expiration, respiratory distress or retractions.     Comments: Lungs clear to auscultation without adventitious lung sounds. Chest:     Chest wall: Tenderness present.  Musculoskeletal:     Cervical back: Normal range of motion and neck supple.  Neurological:     General: No focal deficit present.     Mental Status: She is alert and oriented to person, place, and time.      UC Treatments / Results  Labs (all labs ordered are listed, but only abnormal results are displayed) Labs Reviewed  NOVEL CORONAVIRUS, NAA    EKG   Radiology No results found.  Procedures Procedures (including critical care time)  Medications Ordered in UC Medications - No data to display  Initial Impression / Assessment and Plan / UC Course  I have reviewed the triage vital signs and the nursing notes.  Pertinent labs & imaging results that were available during my care of the patient were reviewed by me and considered in my medical decision making (see chart for details).    COVID PCR test ordered. Patient to quarantine until testing results return. No alarming signs on exam.  Patient speaking in full sentences without respiratory distress.  Symptomatic treatment discussed.  Push fluids.  Return precautions given.  Patient expresses understanding and agrees to plan.  Final Clinical Impressions(s) / UC Diagnoses   Final diagnoses:  SOB (shortness of breath)  Loss of taste  Loss of smell  Suspected COVID-19 virus infection   ED Prescriptions    Medication Sig Dispense Auth. Provider   albuterol (VENTOLIN HFA) 108 (90 Base) MCG/ACT inhaler Inhale 1-2 puffs into the lungs every 6 (six) hours as needed  for wheezing or shortness of breath. 8 g Myca Perno V, PA-C   propranolol (INDERAL) 20 MG tablet Take 1.5 tablets (30 mg total) by mouth 3 (three) times daily. 135 tablet Belinda Fisher, PA-C     PDMP not reviewed this encounter.   Belinda Fisher, PA-C 06/15/19 1139

## 2019-06-16 LAB — NOVEL CORONAVIRUS, NAA: SARS-CoV-2, NAA: NOT DETECTED

## 2019-09-23 ENCOUNTER — Encounter: Payer: Self-pay | Admitting: Physician Assistant

## 2019-09-23 ENCOUNTER — Ambulatory Visit (INDEPENDENT_AMBULATORY_CARE_PROVIDER_SITE_OTHER): Payer: BC Managed Care – PPO | Admitting: Physician Assistant

## 2019-09-23 ENCOUNTER — Other Ambulatory Visit: Payer: Self-pay

## 2019-09-23 ENCOUNTER — Telehealth: Payer: Self-pay | Admitting: Physician Assistant

## 2019-09-23 VITALS — BP 110/80 | HR 81 | Temp 98.0°F | Ht 65.0 in | Wt 149.2 lb

## 2019-09-23 DIAGNOSIS — E282 Polycystic ovarian syndrome: Secondary | ICD-10-CM | POA: Insufficient documentation

## 2019-09-23 DIAGNOSIS — F339 Major depressive disorder, recurrent, unspecified: Secondary | ICD-10-CM | POA: Diagnosis not present

## 2019-09-23 DIAGNOSIS — F1111 Opioid abuse, in remission: Secondary | ICD-10-CM

## 2019-09-23 DIAGNOSIS — F4310 Post-traumatic stress disorder, unspecified: Secondary | ICD-10-CM | POA: Diagnosis not present

## 2019-09-23 DIAGNOSIS — Z975 Presence of (intrauterine) contraceptive device: Secondary | ICD-10-CM

## 2019-09-23 DIAGNOSIS — F419 Anxiety disorder, unspecified: Secondary | ICD-10-CM | POA: Diagnosis not present

## 2019-09-23 DIAGNOSIS — F429 Obsessive-compulsive disorder, unspecified: Secondary | ICD-10-CM | POA: Diagnosis not present

## 2019-09-23 DIAGNOSIS — Z800 Family history of malignant neoplasm of digestive organs: Secondary | ICD-10-CM

## 2019-09-23 MED ORDER — FLUOXETINE HCL 20 MG PO TABS: 20.0000 mg | ORAL_TABLET | Freq: Every day | ORAL | 0 refills | Status: DC

## 2019-09-23 MED ORDER — PROPRANOLOL HCL 20 MG PO TABS: 30.0000 mg | ORAL_TABLET | Freq: Three times a day (TID) | ORAL | 0 refills | Status: DC

## 2019-09-23 MED ORDER — FLUOXETINE HCL 40 MG PO CAPS: 40.0000 mg | ORAL_CAPSULE | Freq: Every day | ORAL | 0 refills | Status: DC

## 2019-09-23 MED ORDER — QUETIAPINE FUMARATE 25 MG PO TABS: 25.0000 mg | ORAL_TABLET | Freq: Every day | ORAL | 0 refills | Status: DC

## 2019-09-23 NOTE — Telephone Encounter (Signed)
Jaime Leblanc at [facility] was initially recommended for the patient due to she has experience with counseling for addiction behaviors.  Patient can see any provider there that is experienced with addiction counseling.

## 2019-09-23 NOTE — Patient Instructions (Addendum)
It was great to see you!  I will put in referrals for psychiatry, therapy, gynecology and the gastroenterologist.  Let's follow-up in 3-6 months, sooner if you have concerns.  Take care,  Jarold Motto PA-C

## 2019-09-23 NOTE — Telephone Encounter (Signed)
Tory from [facility] is calling in, they received a referral for the patient but the provider that is listed is not accepting new patients and they do have other providers who are accepting new patients.

## 2019-09-23 NOTE — Progress Notes (Signed)
Jaime Leblanc is a 29 y.o. female here to Establish care.   I acted as a Neurosurgeon for [employer], PA-C Corky Mull, LPN   History of Present Illness:   Chief Complaint  Patient presents with  . Establish Care    Anxiety & Depression Pt is here to establish care and needs refills on medications.   She is currently in recovery from opioid abuse. Addicted from age [age]. She recently underwent treatment for 6 months at the [facility].  She currently has diagnoses of depression, GAD, OCD and PTSD. She is interested in seeing a counselor and psychiatrist.  She is currently on Prozac 60 mg daily, 30 mg propranolol 3 times daily, Seroquel 25 mg daily.  Unfortunately she has been out of her Prozac and propanolol for about 1 month.  She takes Seroquel every other night on average for sleep.  History of suicide attempt in the past.  Family history of colon cancer Mom was diagnosed around age 3 or 57 per patient report.  She denies any current issues.  IUD Patient is interested in possible change of birth control and seeing an OB/GYN.  Would like a referral.  Weight -- Weight: 149 lb 4 oz (67.7 kg)   Depression screen [facility] 2/9 09/23/2019  Decreased Interest 2  Down, Depressed, Hopeless 2  PHQ - 2 Score 4  Altered sleeping 3  Tired, decreased energy 2  Change in appetite 2  Feeling bad or failure about yourself  0  Trouble concentrating 1  Moving slowly or fidgety/restless 2  Suicidal thoughts 0  PHQ-9 Score 14  Difficult doing work/chores Very difficult    GAD 7 : Generalized Anxiety Score 09/23/2019  Nervous, Anxious, on Edge 3  Control/stop worrying 3  Worry too much - different things 3  Trouble relaxing 3  Restless 2  Easily annoyed or irritable 3  Afraid - awful might happen 3  Total GAD 7 Score 20  Anxiety Difficulty Very difficult     Other providers/specialists: Patient Care Team: Jarold Motto, [name] as PCP - General (Physician Assistant) Laurey Morale, MD as Consulting Physician (Obstetrics and Gynecology) Aletha Halim, MD as Consulting Physician (Neurology) Daneen Schick, OD as Consulting Physician (Optometry) Shelly Coss, PHD, PMHNP-BC as Consulting Physician (Psychiatry)   Past Medical History:  Diagnosis Date  . Anxiety   . Depression   . Endometriosis 2019  . Fibromyalgia   . Headache   . HPV in female   . Hypertension   . Narcotic abuse in remission (HCC)   . Neuropathy    Left hand and in muscle all over the body  . OCD (obsessive compulsive disorder)   . Optic neuritis    Left Eye  . Panic disorder      Social History   Socioeconomic History  . Marital status: Single    Spouse name: Not on file  . Number of children: Not on file  . Years of education: Not on file  . Highest education level: Not on file  Occupational History  . Not on file  Tobacco Use  . Smoking status: Former Smoker    Quit date: 08/15/2010    Years since quitting: 9.1  . Smokeless tobacco: Never Used  Substance and Sexual Activity  . Alcohol use: Not Currently  . Drug use: No  . Sexual activity: Yes    Birth control/protection: I.U.D.  Other Topics Concern  . Not on file  Social History Narrative  . Not on file  Social Determinants of Health   Financial Resource Strain:   . Difficulty of Paying Living Expenses:   Food Insecurity:   . Worried About Charity fundraiser in the Last Year:   . Arboriculturist in the Last Year:   Transportation Needs:   . Film/video editor (Medical):   Marland Kitchen Lack of Transportation (Non-Medical):   Physical Activity:   . Days of Exercise per Week:   . Minutes of Exercise per Session:   Stress:   . Feeling of Stress :   Social Connections:   . Frequency of Communication with Friends and Family:   . Frequency of Social Gatherings with Friends and Family:   . Attends Religious Services:   . Active Member of Clubs or Organizations:   . Attends Archivist Meetings:   Marland Kitchen  Marital Status:   Intimate Partner Violence:   . Fear of Current or Ex-Partner:   . Emotionally Abused:   Marland Kitchen Physically Abused:   . Sexually Abused:     Past Surgical History:  Procedure Laterality Date  . LAPAROSCOPY  2019  . WISDOM TOOTH EXTRACTION      Family History  Problem Relation Age of Onset  . Colon cancer Mother 62  . Hypertension Mother   . Depression Mother   . Anxiety disorder Mother   . Hypertension Father   . Gout Father   . Skin cancer Father   . Kidney cancer Father   . Skin cancer Maternal Grandmother     Allergies  Allergen Reactions  . Azithromycin Itching  . Lamotrigine Rash  . Ondansetron Other (See Comments)    Headaches  . Codeine Itching  . Lidocaine Other (See Comments)    Skin reaction  . Menthol Itching    Menthol in lotion-pt states this makes her skin feel like it is burning  . Topiramate Other (See Comments)    Leg pain  . Tramadol Other (See Comments)     Current Medications:   Current Outpatient Medications:  .  albuterol (VENTOLIN HFA) 108 (90 Base) MCG/ACT inhaler, Inhale 1-2 puffs into the lungs every 6 (six) hours as needed for wheezing or shortness of breath., Disp: 8 g, Rfl: 0 .  ibuprofen (ADVIL) 800 MG tablet, Take 800 mg by mouth every 8 (eight) hours as needed., Disp: , Rfl:  .  levonorgestrel (MIRENA) 20 MCG/24HR IUD, by Intrauterine route., Disp: , Rfl:  .  propranolol (INDERAL) 20 MG tablet, Take 1.5 tablets (30 mg total) by mouth 3 (three) times daily., Disp: 405 tablet, Rfl: 0 .  QUEtiapine (SEROQUEL) 25 MG tablet, Take 1 tablet (25 mg total) by mouth at bedtime., Disp: 90 tablet, Rfl: 0 .  FLUoxetine (PROZAC) 20 MG tablet, Take 1 tablet (20 mg total) by mouth daily., Disp: 30 tablet, Rfl: 0 .  FLUoxetine (PROZAC) 40 MG capsule, Take 1 capsule (40 mg total) by mouth daily., Disp: 90 capsule, Rfl: 0   Review of Systems:   ROS  Negative unless otherwise specified per HPI.  Vitals:   Vitals:   09/23/19 0852   BP: 110/80  Pulse: 81  Temp: 98 F (36.7 C)  TempSrc: Temporal  SpO2: 99%  Weight: 149 lb 4 oz (67.7 kg)  Height: 5\' 5"  (1.651 m)      Body mass index is 24.84 kg/m.  Physical Exam:   Physical Exam Vitals and nursing note reviewed.  Constitutional:      General: She is not in acute distress.  Appearance: She is well-developed. She is not ill-appearing or toxic-appearing.  Cardiovascular:     Rate and Rhythm: Normal rate and regular rhythm.     Pulses: Normal pulses.     Heart sounds: Normal heart sounds, S1 normal and S2 normal.     Comments: No LE edema Pulmonary:     Effort: Pulmonary effort is normal.     Breath sounds: Normal breath sounds.  Skin:    General: Skin is warm and dry.  Neurological:     Mental Status: She is alert.     GCS: GCS eye subscore is 4. GCS verbal subscore is 5. GCS motor subscore is 6.  Psychiatric:        Speech: Speech normal.        Behavior: Behavior normal. Behavior is cooperative.       Assessment and Plan:   Lakara was seen today for establish care.  Diagnoses and all orders for this visit:  Major depressive disorder, recurrent episode with anxious distress (HCC);  Anxiety; Obsessive-compulsive disorder, unspecified type; PTSD (post-traumatic stress disorder) Will refer to [facility] for counseling.  I have also reached out to one of my psychiatric colleagues to see who would be a good psychiatric fit for patient.  I have refilled her medications for 3 months. I discussed with patient that if they develop any SI, to tell someone immediately and seek medical attention.  Family history of colon cancer in mother Referral for screening colonoscopy.  IUD (intrauterine device) in place Referral for management of IUD.  Other orders -     propranolol (INDERAL) 20 MG tablet; Take 1.5 tablets (30 mg total) by mouth 3 (three) times daily. -     FLUoxetine (PROZAC) 20 MG tablet; Take 1 tablet (20 mg total) by mouth daily. -      FLUoxetine (PROZAC) 40 MG capsule; Take 1 capsule (40 mg total) by mouth daily. -     QUEtiapine (SEROQUEL) 25 MG tablet; Take 1 tablet (25 mg total) by mouth at bedtime.  . Reviewed expectations re: course of current medical issues. . Discussed self-management of symptoms. . Outlined signs and symptoms indicating need for more acute intervention. . Patient verbalized understanding and all questions were answered. . See orders for this visit as documented in the electronic medical record. . Patient received an After-Visit Summary.  CMA or LPN served as scribe during this visit. History, Physical, and Plan performed by medical provider. The above documentation has been reviewed and is accurate and complete.   Jarold Motto, PA-C

## 2019-09-23 NOTE — Telephone Encounter (Signed)
Please see message. °

## 2019-10-04 ENCOUNTER — Encounter: Payer: Self-pay | Admitting: Physician Assistant

## 2019-10-04 NOTE — Telephone Encounter (Signed)
Please advise 

## 2019-10-07 ENCOUNTER — Encounter: Payer: Self-pay | Admitting: Gastroenterology

## 2019-10-10 ENCOUNTER — Ambulatory Visit: Payer: Self-pay | Admitting: Obstetrics and Gynecology

## 2019-10-19 ENCOUNTER — Other Ambulatory Visit: Payer: Self-pay | Admitting: Physician Assistant

## 2019-10-21 ENCOUNTER — Ambulatory Visit: Payer: Self-pay | Admitting: Obstetrics and Gynecology

## 2019-11-09 ENCOUNTER — Ambulatory Visit: Payer: BC Managed Care – PPO | Admitting: Gastroenterology

## 2019-11-24 ENCOUNTER — Ambulatory Visit: Payer: Self-pay | Admitting: Obstetrics and Gynecology

## 2019-12-17 ENCOUNTER — Other Ambulatory Visit: Payer: Self-pay | Admitting: Physician Assistant

## 2020-01-05 ENCOUNTER — Ambulatory Visit
Admission: EM | Admit: 2020-01-05 | Discharge: 2020-01-05 | Disposition: A | Discharge status: Home / Self Care | Payer: BC Managed Care – PPO | Attending: Emergency Medicine | Admitting: Emergency Medicine

## 2020-01-05 ENCOUNTER — Ambulatory Visit: Payer: Self-pay

## 2020-01-05 ENCOUNTER — Other Ambulatory Visit: Payer: Self-pay

## 2020-01-05 ENCOUNTER — Encounter: Payer: Self-pay | Admitting: Emergency Medicine

## 2020-01-05 DIAGNOSIS — J069 Acute upper respiratory infection, unspecified: Secondary | ICD-10-CM | POA: Diagnosis not present

## 2020-01-05 MED ORDER — FLUTICASONE PROPIONATE 50 MCG/ACT NA SUSP
1.0000 | Freq: Every day | NASAL | 0 refills | Status: DC
Start: 2020-01-05 — End: 2022-08-07

## 2020-01-05 MED ORDER — CETIRIZINE HCL 10 MG PO TABS
10.0000 mg | ORAL_TABLET | Freq: Every day | ORAL | 0 refills | Status: DC
Start: 2020-01-05 — End: 2020-10-19

## 2020-01-05 NOTE — ED Provider Notes (Signed)
[name]    CSN: [id number] Arrival date & time: 01/05/20  1027      History   Chief Complaint Chief Complaint  Patient presents with  . URI    HPI Jaime Leblanc is a 29 y.o. female with history of fibromyalgia, hypertension presenting for URI symptoms x4 days.  Patient writes history: Endorsing generalized headache, rhinorrhea, nasal congestion, sinus pressure, sore throat, itchy ears.  States scratchy throat positive cough, though denies worsening cough or productive cough, chest pain, difficulty breathing.  Has not tried thing for this.  No known sick contacts.  Did not receive Covid vaccine.    Past Medical History:  Diagnosis Date  . Anxiety   . Depression   . Endometriosis 2019  . Fibromyalgia   . Headache   . HPV in female   . Hypertension   . Narcotic abuse in remission (HCC)   . Neuropathy    Left hand and in muscle all over the body  . OCD (obsessive compulsive disorder)   . Optic neuritis    Left Eye  . Panic disorder     Patient Active Problem List   Diagnosis Date Noted  . PTSD (post-traumatic stress disorder) 09/23/2019  . PCOS (polycystic ovarian syndrome) 09/23/2019  . Narcotic abuse in remission (HCC)   . Endometriosis determined by laparoscopy 09/30/2017  . Pelvic pain in female 09/30/2017  . Major depressive disorder, recurrent episode with anxious distress (HCC) 06/22/2016  . Oral thrush 01/09/2016  . OCD (obsessive compulsive disorder) 08/15/2014  . Frequent headaches 08/15/2014  . Anxiety 07/18/2014    Past Surgical History:  Procedure Laterality Date  . LAPAROSCOPY  2019  . WISDOM TOOTH EXTRACTION      OB History   No obstetric history on file.      Home Medications    Prior to Admission medications   Medication Sig Start Date End Date Taking? Authorizing Provider  cetirizine (ZYRTEC ALLERGY) 10 MG tablet Take 1 tablet (10 mg total) by mouth daily. 01/05/20   Hall-Potvin, [place], PA-C  FLUoxetine (PROZAC) 20  MG tablet TAKE 1 TABLET(20 MG) BY MOUTH DAILY 10/20/19   Jarold Motto, PA  FLUoxetine (PROZAC) 40 MG capsule Take 1 capsule (40 mg total) by mouth daily. 09/23/19   Jarold Motto, PA  fluticasone (FLONASE) 50 MCG/ACT nasal spray Place 1 spray into both nostrils daily. 01/05/20   Hall-Potvin, [place], PA-C  ibuprofen (ADVIL) 800 MG tablet Take 800 mg by mouth every 8 (eight) hours as needed.    [provider]  levonorgestrel (MIRENA) 20 MCG/24HR IUD by Intrauterine route.    [provider]  propranolol (INDERAL) 20 MG tablet TAKE 1 AND 1/2 TABLETS BY MOUTH THREE TIMES DAILY 12/19/19   Jarold Motto, PA  QUEtiapine (SEROQUEL) 25 MG tablet TAKE 1 TABLET(25 MG) BY MOUTH AT BEDTIME 12/19/19   Jarold Motto, PA  albuterol (VENTOLIN HFA) 108 (90 Base) MCG/ACT inhaler Inhale 1-2 puffs into the lungs every 6 (six) hours as needed for wheezing or shortness of breath. 06/15/19 01/05/20  Belinda Fisher, PA-C    Family History Family History  Problem Relation Age of Onset  . Colon cancer Mother 48  . Hypertension Mother   . Depression Mother   . Anxiety disorder Mother   . Hypertension Father   . Gout Father   . Skin cancer Father   . Kidney cancer Father   . Osteoarthritis Father   . Depression Father   . Skin cancer Maternal Grandmother   .  Osteoarthritis Maternal Grandfather   . Diabetes Maternal Grandfather   . COPD Paternal Grandmother   . Heart disease Paternal Grandmother   . Heart attack Paternal Grandfather   . Hyperlipidemia Paternal Grandfather     Social History Social History   Tobacco Use  . Smoking status: Former Smoker    Quit date: 08/15/2010    Years since quitting: 9.3  . Smokeless tobacco: Never Used  Vaping Use  . Vaping Use: Every day  Substance Use Topics  . Alcohol use: Not Currently  . Drug use: No     Allergies   Azithromycin, Lamotrigine, Ondansetron, Codeine, Lidocaine, Menthol, Topiramate, and Tramadol   Review of Systems As per  HPI   Physical Exam Triage Vital Signs ED Triage Vitals  Enc Vitals Group     BP      Pulse      Resp      Temp      Temp src      SpO2      Weight      Height      Head Circumference      Peak Flow      Pain Score      Pain Loc      Pain Edu?      Excl. in GC?    No data found.  Updated Vital Signs BP 127/82 (BP Location: Left Arm)   Pulse 75   Temp 98.2 F (36.8 C) (Oral)   Resp 16   SpO2 98%   Visual Acuity Right Eye Distance:   Left Eye Distance:   Bilateral Distance:    Right Eye Near:   Left Eye Near:    Bilateral Near:     Physical Exam Constitutional:      General: She is not in acute distress.    Appearance: She is obese. She is not ill-appearing or diaphoretic.  HENT:     Head: Normocephalic and atraumatic.     Mouth/Throat:     Mouth: Mucous membranes are moist.     Pharynx: Oropharynx is clear. No oropharyngeal exudate or posterior oropharyngeal erythema.  Eyes:     General: No scleral icterus.    Conjunctiva/sclera: Conjunctivae normal.     Pupils: Pupils are equal, round, and reactive to light.  Neck:     Comments: Trachea midline, negative JVD Cardiovascular:     Rate and Rhythm: Normal rate and regular rhythm.     Heart sounds: No murmur heard.  No gallop.   Pulmonary:     Effort: Pulmonary effort is normal. No respiratory distress.     Breath sounds: No wheezing, rhonchi or rales.  Musculoskeletal:     Cervical back: Neck supple. No tenderness.  Lymphadenopathy:     Cervical: No cervical adenopathy.  Skin:    Capillary Refill: Capillary refill takes less than 2 seconds.     Coloration: Skin is not jaundiced or pale.     Findings: No rash.  Neurological:     General: No focal deficit present.     Mental Status: She is alert and oriented to person, place, and time.      UC Treatments / Results  Labs (all labs ordered are listed, but only abnormal results are displayed) Labs Reviewed  NOVEL CORONAVIRUS, NAA     EKG   Radiology No results found.  Procedures Procedures (including critical care time)  Medications Ordered in UC Medications - No data to display  Initial Impression / Assessment and  Plan / UC Course  I have reviewed the triage vital signs and the nursing notes.  Pertinent labs & imaging results that were available during my care of the patient were reviewed by me and considered in my medical decision making (see chart for details).     Patient afebrile, nontoxic, with SpO2 98%.  Covid PCR pending.  Patient to quarantine until results are back.  We will treat supportively as outlined below.  Return precautions discussed, patient verbalized understanding and is agreeable to plan. Final Clinical Impressions(s) / UC Diagnoses   Final diagnoses:  Acute URI     Discharge Instructions     Your COVID test is pending - it is important to quarantine / isolate at home until your results are back. If you test positive and would like further evaluation for persistent or worsening symptoms, you may schedule an E-visit or virtual (video) visit throughout the [facility] app or website.  PLEASE NOTE: If you develop severe chest pain or shortness of breath please go to the ER or call 9-1-1 for further evaluation --> DO NOT schedule electronic or virtual visits for this. Please call our office for further guidance / recommendations as needed.  For information about the Covid vaccine, please visit [email or web address]    ED Prescriptions    Medication Sig Dispense Auth. Provider   cetirizine (ZYRTEC ALLERGY) 10 MG tablet Take 1 tablet (10 mg total) by mouth daily. 30 tablet Hall-Potvin, [place], PA-C   fluticasone (FLONASE) 50 MCG/ACT nasal spray Place 1 spray into both nostrils daily. 16 g Hall-Potvin, [place], PA-C     PDMP not reviewed this encounter.   Hall-Potvin, [place], [place] 01/05/20 1105

## 2020-01-05 NOTE — ED Triage Notes (Addendum)
Pt presents to [facility] for assessment of 4 days of headache, nasal congestion, runny nose, sore throat, a tickle in her throat that causes he to cough, bilateral ear burning

## 2020-01-05 NOTE — Discharge Instructions (Addendum)
Your COVID test is pending - it is important to quarantine / isolate at home until your results are back. °If you test positive and would like further evaluation for persistent or worsening symptoms, you may schedule an E-visit or virtual (video) visit throughout the [facility] My[facility] app or website. ° °PLEASE NOTE: If you develop severe chest pain or shortness of breath please go to the ER or call 9-1-1 for further evaluation --> DO NOT schedule electronic or virtual visits for this. °Please call our office for further guidance / recommendations as needed. ° °For information about the Covid vaccine, please visit [email or web address] °

## 2020-01-06 LAB — NOVEL CORONAVIRUS, NAA: SARS-CoV-2, NAA: NOT DETECTED

## 2020-01-06 LAB — SARS-COV-2, NAA 2 DAY TAT

## 2020-01-23 ENCOUNTER — Ambulatory Visit: Payer: BC Managed Care – PPO | Admitting: Physician Assistant

## 2020-01-23 DIAGNOSIS — Z0289 Encounter for other administrative examinations: Secondary | ICD-10-CM

## 2020-01-23 NOTE — Progress Notes (Deleted)
Jaime Leblanc is a 29 y.o. female is here for follow up.  I acted as a Neurosurgeon for [employer], PA-C [employer], LPN   History of Present Illness:   No chief complaint on file.   HPI  Anxiety/Depression Pt is following up today, currently taking Prozac 40 mg daily, Propranolol 30 mg TID, Seroquel 25 mg at bedtime.  Health Maintenance Due  Topic Date Due  . Hepatitis C Screening  Never done  . COVID-19 Vaccine (1) Never done  . PAP-Cervical Cytology Screening  Never done  . PAP SMEAR-Modifier  10/30/2017  . INFLUENZA VACCINE  01/01/2020    Past Medical History:  Diagnosis Date  . Anxiety   . Depression   . Endometriosis 2019  . Fibromyalgia   . Headache   . HPV in female   . Hypertension   . Narcotic abuse in remission (HCC)   . Neuropathy    Left hand and in muscle all over the body  . OCD (obsessive compulsive disorder)   . Optic neuritis    Left Eye  . Panic disorder      Social History   Tobacco Use  . Smoking status: Former Smoker    Quit date: 08/15/2010    Years since quitting: 9.4  . Smokeless tobacco: Never Used  Vaping Use  . Vaping Use: Every day  Substance Use Topics  . Alcohol use: Not Currently  . Drug use: No    Past Surgical History:  Procedure Laterality Date  . LAPAROSCOPY  2019  . WISDOM TOOTH EXTRACTION      Family History  Problem Relation Age of Onset  . Colon cancer Mother 68  . Hypertension Mother   . Depression Mother   . Anxiety disorder Mother   . Hypertension Father   . Gout Father   . Skin cancer Father   . Kidney cancer Father   . Osteoarthritis Father   . Depression Father   . Skin cancer Maternal Grandmother   . Osteoarthritis Maternal Grandfather   . Diabetes Maternal Grandfather   . COPD Paternal Grandmother   . Heart disease Paternal Grandmother   . Heart attack Paternal Grandfather   . Hyperlipidemia Paternal Grandfather     PMHx, SurgHx, SocialHx, FamHx, Medications, and Allergies were  reviewed in the Visit Navigator and updated as appropriate.   Patient Active Problem List   Diagnosis Date Noted  . PTSD (post-traumatic stress disorder) 09/23/2019  . PCOS (polycystic ovarian syndrome) 09/23/2019  . Narcotic abuse in remission (HCC)   . Endometriosis determined by laparoscopy 09/30/2017  . Pelvic pain in female 09/30/2017  . Major depressive disorder, recurrent episode with anxious distress (HCC) 06/22/2016  . Oral thrush 01/09/2016  . OCD (obsessive compulsive disorder) 08/15/2014  . Frequent headaches 08/15/2014  . Anxiety 07/18/2014    Social History   Tobacco Use  . Smoking status: Former Smoker    Quit date: 08/15/2010    Years since quitting: 9.4  . Smokeless tobacco: Never Used  Vaping Use  . Vaping Use: Every day  Substance Use Topics  . Alcohol use: Not Currently  . Drug use: No    Current Medications and Allergies:    Current Outpatient Medications:  .  cetirizine (ZYRTEC ALLERGY) 10 MG tablet, Take 1 tablet (10 mg total) by mouth daily., Disp: 30 tablet, Rfl: 0 .  FLUoxetine (PROZAC) 20 MG tablet, TAKE 1 TABLET(20 MG) BY MOUTH DAILY, Disp: 90 tablet, Rfl: 0 .  FLUoxetine (PROZAC) 40  MG capsule, Take 1 capsule (40 mg total) by mouth daily., Disp: 90 capsule, Rfl: 0 .  fluticasone (FLONASE) 50 MCG/ACT nasal spray, Place 1 spray into both nostrils daily., Disp: 16 g, Rfl: 0 .  ibuprofen (ADVIL) 800 MG tablet, Take 800 mg by mouth every 8 (eight) hours as needed., Disp: , Rfl:  .  levonorgestrel (MIRENA) 20 MCG/24HR IUD, by Intrauterine route., Disp: , Rfl:  .  propranolol (INDERAL) 20 MG tablet, TAKE 1 AND 1/2 TABLETS BY MOUTH THREE TIMES DAILY, Disp: 405 tablet, Rfl: 0 .  QUEtiapine (SEROQUEL) 25 MG tablet, TAKE 1 TABLET(25 MG) BY MOUTH AT BEDTIME, Disp: 90 tablet, Rfl: 0  Allergies  Allergen Reactions  . Azithromycin Itching  . Lamotrigine Rash  . Ondansetron Other (See Comments)    Headaches  . Codeine Itching  . Lidocaine Other (See  Comments)    Skin reaction  . Menthol Itching    Menthol in lotion-pt states this makes her skin feel like it is burning  . Topiramate Other (See Comments)    Leg pain  . Tramadol Other (See Comments)    Review of Systems   ROS  Vitals:  There were no vitals filed for this visit.   There is no height or weight on file to calculate BMI.   Physical Exam:    Physical Exam   Assessment and Plan:    There are no diagnoses linked to this encounter.  . Reviewed expectations re: course of current medical issues. . Discussed self-management of symptoms. . Outlined signs and symptoms indicating need for more acute intervention. . Patient verbalized understanding and all questions were answered. . See orders for this visit as documented in the electronic medical record. . Patient received an After Visit Summary.  ***  Jarold Motto, PA-C [place] [place] 01/23/2020  Follow-up: No follow-ups on file.

## 2020-02-02 ENCOUNTER — Encounter: Payer: Self-pay | Admitting: Physician Assistant

## 2020-02-02 DIAGNOSIS — Z30432 Encounter for removal of intrauterine contraceptive device: Secondary | ICD-10-CM | POA: Diagnosis not present

## 2020-02-02 DIAGNOSIS — Z6824 Body mass index (BMI) 24.0-24.9, adult: Secondary | ICD-10-CM | POA: Diagnosis not present

## 2020-02-02 DIAGNOSIS — Z01419 Encounter for gynecological examination (general) (routine) without abnormal findings: Secondary | ICD-10-CM | POA: Diagnosis not present

## 2020-03-09 ENCOUNTER — Encounter: Payer: Self-pay | Admitting: Physician Assistant

## 2020-03-11 ENCOUNTER — Other Ambulatory Visit: Payer: Self-pay | Admitting: Physician Assistant

## 2020-03-11 MED ORDER — QUETIAPINE FUMARATE 25 MG PO TABS: ORAL_TABLET | ORAL | 1 refills | Status: DC

## 2020-03-16 ENCOUNTER — Other Ambulatory Visit: Payer: Self-pay | Admitting: Physician Assistant

## 2020-03-19 ENCOUNTER — Other Ambulatory Visit: Payer: Self-pay | Admitting: *Deleted

## 2020-03-19 MED ORDER — QUETIAPINE FUMARATE 25 MG PO TABS
25.0000 mg | ORAL_TABLET | Freq: Every day | ORAL | 1 refills | Status: DC
Start: 2020-03-19 — End: 2020-05-24

## 2020-03-19 MED ORDER — PROPRANOLOL HCL 20 MG PO TABS: ORAL_TABLET | ORAL | 0 refills | Status: DC

## 2020-04-28 ENCOUNTER — Other Ambulatory Visit: Payer: Self-pay | Admitting: Physician Assistant

## 2020-05-23 ENCOUNTER — Encounter: Payer: Self-pay | Admitting: Physician Assistant

## 2020-05-24 MED ORDER — QUETIAPINE FUMARATE 25 MG PO TABS
25.0000 mg | ORAL_TABLET | Freq: Every day | ORAL | 0 refills | Status: DC
Start: 2020-05-24 — End: 2020-06-19

## 2020-06-05 ENCOUNTER — Encounter: Payer: Self-pay | Admitting: Physician Assistant

## 2020-06-06 ENCOUNTER — Other Ambulatory Visit: Payer: Self-pay

## 2020-06-06 ENCOUNTER — Ambulatory Visit: Payer: BC Managed Care – PPO | Admitting: Physician Assistant

## 2020-06-06 ENCOUNTER — Encounter: Payer: Self-pay | Admitting: Physician Assistant

## 2020-06-06 ENCOUNTER — Ambulatory Visit (INDEPENDENT_AMBULATORY_CARE_PROVIDER_SITE_OTHER): Payer: BC Managed Care – PPO | Admitting: Family Medicine

## 2020-06-06 ENCOUNTER — Encounter: Payer: Self-pay | Admitting: Family Medicine

## 2020-06-06 VITALS — BP 100/70 | HR 87 | Temp 98.2°F | Ht 65.0 in | Wt 167.5 lb

## 2020-06-06 VITALS — BP 100/70 | HR 87 | Ht 65.0 in | Wt 167.5 lb

## 2020-06-06 DIAGNOSIS — Z23 Encounter for immunization: Secondary | ICD-10-CM

## 2020-06-06 DIAGNOSIS — M26622 Arthralgia of left temporomandibular joint: Secondary | ICD-10-CM | POA: Insufficient documentation

## 2020-06-06 DIAGNOSIS — R6884 Jaw pain: Secondary | ICD-10-CM

## 2020-06-06 MED ORDER — PREDNISONE 50 MG PO TABS: 50.0000 mg | ORAL_TABLET | Freq: Every day | ORAL | 0 refills | Status: DC

## 2020-06-06 MED ORDER — TIZANIDINE HCL 4 MG PO TABS: 4.0000 mg | ORAL_TABLET | Freq: Four times a day (QID) | ORAL | 1 refills | Status: DC | PRN

## 2020-06-06 NOTE — Patient Instructions (Signed)
Thank you for coming in today.  Take the prednisone.  Try the muscle relaxer.   I've referred you to Physical Therapy.  Let us know if you don't hear from them in one week.  Ok to increase the seroqeul up to 100mg  at bedtime as needed for insomnia. This may be temporary with the prednisone.   Let me and Sam know if this is not working.   A dentist is not a bad idea.    Generally you should take NSAIDs or Aspirin with prednisone. Tylenol is ok.   If you HAVE to take something in addition to tylenol with prednisone Ibuprofen is probably the safest. Take it with pepcid or Prilosec to protect your stomach.    Temporomandibular Joint Syndrome  Temporomandibular joint syndrome (TMJ syndrome) is a condition that causes pain in the temporomandibular joints. These joints are located near your ears and allow your jaw to open and close. For people with TMJ syndrome, chewing, biting, or other movements of the jaw can be difficult or painful. TMJ syndrome is often mild and goes away within a few weeks. However, sometimes the condition becomes a long-term (chronic) problem. What are the causes? This condition may be caused by:  Grinding your teeth or clenching your jaw. Some people do this when they are under stress.  Arthritis.  Injury to the jaw.  Head or neck injury.  Teeth or dentures that are not aligned well. In some cases, the cause of TMJ syndrome may not be known. What are the signs or symptoms? The most common symptom of this condition is an aching pain on the side of the head in the area of the TMJ. Other symptoms may include:  Pain when moving your jaw, such as when chewing or biting.  Being unable to open your jaw all the way.  Making a clicking sound when you open your mouth.  Headache.  Earache.  Neck or shoulder pain. How is this diagnosed? This condition may be diagnosed based on:  Your symptoms and medical history.  A physical exam. Your health care provider  may check the range of motion of your jaw.  Imaging tests, such as X-rays or an MRI. You may also need to see your dentist, who will determine if your teeth and jaw are lined up correctly. How is this treated? TMJ syndrome often goes away on its own. If treatment is needed, the options may include:  Eating soft foods and applying ice or heat.  Medicines to relieve pain or inflammation.  Medicines or massage to relax the muscles.  A splint, bite plate, or mouthpiece to prevent teeth grinding or jaw clenching.  Relaxation techniques or counseling to help reduce stress.  A therapy for pain in which an electrical current is applied to the nerves through the skin (transcutaneous electrical nerve stimulation).  Acupuncture. This is sometimes helpful to relieve pain.  Jaw surgery. This is rarely needed. Follow these instructions at home:  Eating and drinking  Eat a soft diet if you are having trouble chewing.  Avoid foods that require a lot of chewing. Do not chew gum. General instructions  Take over-the-counter and prescription medicines only as told by your health care provider.  If directed, put ice on the painful area. ? Put ice in a plastic bag. ? Place a towel between your skin and the bag. ? Leave the ice on for 20 minutes, 2-3 times a day.  Apply a warm, wet cloth (warm compress) to the painful  area as directed.  Massage your jaw area and do any jaw stretching exercises as told by your health care provider.  If you were given a splint, bite plate, or mouthpiece, wear it as told by your health care provider.  Keep all follow-up visits as told by your health care provider. This is important. Contact a health care provider if:  You are having trouble eating.  You have new or worsening symptoms. Get help right away if:  Your jaw locks open or closed. Summary  Temporomandibular joint syndrome (TMJ syndrome) is a condition that causes pain in the temporomandibular  joints. These joints are located near your ears and allow your jaw to open and close.  TMJ syndrome is often mild and goes away within a few weeks. However, sometimes the condition becomes a long-term (chronic) problem.  Symptoms include an aching pain on the side of the head in the area of the TMJ, pain when chewing or biting, and being unable to open your jaw all the way. You may also make a clicking sound when you open your mouth.  TMJ syndrome often goes away on its own. If treatment is needed, it may include medicines to relieve pain, reduce inflammation, or relax the muscles. A splint, bite plate, or mouthpiece may also be used to prevent teeth grinding or jaw clenching. This information is not intended to replace advice given to you by your health care provider. Make sure you discuss any questions you have with your health care provider. Document Revised: 07/31/2017 Document Reviewed: 06/30/2017 Elsevier Patient Education  2020 [employer].

## 2020-06-06 NOTE — Progress Notes (Signed)
Patient seen in the office today for trismus.  Communicated with Dr. Clementeen Graham who is willing to see patient.  No charge for patient today, I feel as though she will be best supported by seeing specialist at this time.  Jarold Motto PA-C

## 2020-06-06 NOTE — Patient Instructions (Signed)
It was great to see you!  A referral has been placed for you to see Dr. Clementeen Graham with [facility]. Someone from there office will be in touch soon regarding your appointment with him. His location: Psychiatrist Medicine at [facility] [address] on the 1st floor.   Phone number [phone], Fax [phone].  This location is across the street from the entrance to [employer] and in the same complex as the [facility] and [employer] of luck!  I will be able to communicate with Dr. Denyse Amass what he is going to do for you.  Jarold Motto PA-C

## 2020-06-06 NOTE — Progress Notes (Signed)
Subjective:    I'm seeing this patient as a consultation for:  Jaime Leblanc, Jaime Leblanc. Note will be routed back to referring provider/PCP.  CC: Jaw pain  I, Christoper Fabian, LAT, ATC, am serving as scribe for Dr. Clementeen Graham.  HPI: Pt is a 30 y/o female presenting w/ c/o jaw pain since last Wednesday, Dec. 29th w/ progressively worsening ability to open her jaw since Sunday.  She reports the area of greatest restriction is her L TMJ.  Pt recently moved to [place] so does not have a dentist but does see Jaime Cloud, PA as her PCP.  She states that her issue started w/ more pain and slight decrease in jaw opening and has progressively worsened in terms of her ability to open her mouth on the L side.  She has taken IBU, Tylenol and has used topical pain-relieving creams/gels.  She has also tried cold compresses.  She has been unable to eat solid food since Sunday night.  Past medical history, Surgical history, Family history, Social history, Allergies, and medications have been entered into the medical record, reviewed.   Review of Systems: No new headache, visual changes, nausea, vomiting, diarrhea, constipation, dizziness, abdominal pain, skin rash, fevers, chills, night sweats, weight loss, swollen lymph nodes, body aches, joint swelling, muscle aches, chest pain, shortness of breath, mood changes, visual or auditory hallucinations.   Objective:    Vitals:   06/06/20 0848  BP: 100/70  Pulse: 87   General: Well Developed, well nourished, and in no acute distress.  Neuro/Psych: Alert and oriented x3, extra-ocular muscles intact, able to move all 4 extremities, sensation grossly intact. Skin: Warm and dry, no rashes noted.  Respiratory: Not using accessory muscles, speaking in full sentences, trachea midline.  Cardiovascular: Pulses palpable, no extremity edema. Abdomen: Does not appear distended. HEENT: Left ear normal-appearing ear canal normal-appearing tympanic membrane normal-appearing Oropharynx:  Normal-appearing teeth nontender no abscesses or lesions visible in the oropharynx. Unable to open jaw fully due to pain. Left TMJ is tender to palpation.    Impression and Recommendations:    Assessment and Plan: 30 y.o. female with severe left TMJ pain.  Unclear fundamental underlying cause.  Discussed options.  Plan for short course of oral steroids.  Also will try some tizanidine.  She does take Seroquel low-dose at bedtime to help her sleep.  It is likely that she will need to take more of it because of the prednisone.  Discussed temporarily okay to increase Seroquel up to 100 mg at bedtime as needed.  Happy to prescribe more on coordinate with PCP in the future about this if needed. Additionally will refer to PT which can be helpful in this situation.  There are 2 physical therapist at the [facility] location Illene Labrador, or [place]) that can help with the situation.  If this is not sufficient consider medicine such as amitriptyline or nortriptyline at bedtime.  We should be careful to avoid opiates as she does have a history of opioid dependency in the past.  Ultimately could consider trial of injection of TMJ however this is probably best left to specialists in this region such as oral surgeons possibly ENT physicians.  Recheck back with me as needed.  PDMP not reviewed this encounter. Orders Placed This Encounter  Procedures  . Ambulatory referral to Physical Therapy    Referral Priority:   Routine    Referral Type:   Physical Medicine    Referral Reason:   Specialty Services Required  Requested Specialty:   Physical Therapy   Meds ordered this encounter  Medications  . predniSONE (DELTASONE) 50 MG tablet    Sig: Take 1 tablet (50 mg total) by mouth daily.    Dispense:  5 tablet    Refill:  0  . tiZANidine (ZANAFLEX) 4 MG tablet    Sig: Take 1 tablet (4 mg total) by mouth every 6 (six) hours as needed for muscle spasms.    Dispense:  30 tablet    Refill:  1     Discussed warning signs or symptoms. Please see discharge instructions. Patient expresses understanding.   The above documentation has been reviewed and is accurate and complete Clementeen Graham, M.D.

## 2020-06-08 ENCOUNTER — Encounter: Payer: Self-pay | Admitting: Family Medicine

## 2020-06-08 ENCOUNTER — Ambulatory Visit: Payer: BC Managed Care – PPO | Admitting: Physical Therapy

## 2020-06-08 MED ORDER — NORTRIPTYLINE HCL 25 MG PO CAPS
25.0000 mg | ORAL_CAPSULE | Freq: Every evening | ORAL | 2 refills | Status: DC | PRN
Start: 2020-06-08 — End: 2020-10-19

## 2020-06-15 ENCOUNTER — Other Ambulatory Visit: Payer: Self-pay | Admitting: Physician Assistant

## 2020-06-15 ENCOUNTER — Encounter: Payer: Self-pay | Admitting: Family Medicine

## 2020-06-15 MED ORDER — NYSTATIN 100000 UNIT/ML MT SUSP: 500000.0000 [IU] | Freq: Four times a day (QID) | OROMUCOSAL | 0 refills | Status: DC

## 2020-06-19 ENCOUNTER — Telehealth: Payer: Self-pay | Admitting: Family Medicine

## 2020-06-19 MED ORDER — QUETIAPINE FUMARATE 100 MG PO TABS: 100.0000 mg | ORAL_TABLET | Freq: Every day | ORAL | 1 refills | Status: DC

## 2020-06-19 NOTE — Telephone Encounter (Signed)
I reviewed Jaime Leblanc's picture and she does have uneven pupils.    I called her and spoke with her on the phone.  Otherwise she is doing okay with only a little bit of a headache.  She has no other significant neurological symptoms.  She is responding to questions appropriately.  I think it is very unlikely that she is suffering a severe neurologic problem.  After discussion we came to a and agreed plan to wait 12 hours.  She will submit update tomorrow morning.  If still not better will proceed with CT scan of the head.  If worsening tonight she will go to the emergency room.

## 2020-06-19 NOTE — Addendum Note (Signed)
Addended by: Rodolph Bong on: 06/19/2020 11:56 AM   Modules accepted: Orders

## 2020-06-20 ENCOUNTER — Encounter: Payer: Self-pay | Admitting: Family Medicine

## 2020-06-20 ENCOUNTER — Ambulatory Visit (INDEPENDENT_AMBULATORY_CARE_PROVIDER_SITE_OTHER): Payer: BC Managed Care – PPO | Admitting: Family Medicine

## 2020-06-20 ENCOUNTER — Emergency Department (HOSPITAL_COMMUNITY)
Admission: EM | Admit: 2020-06-20 | Discharge: 2020-06-20 | Disposition: A | Discharge status: Home / Self Care | Payer: BC Managed Care – PPO | Attending: Emergency Medicine | Admitting: Emergency Medicine

## 2020-06-20 ENCOUNTER — Encounter (HOSPITAL_COMMUNITY): Payer: Self-pay

## 2020-06-20 ENCOUNTER — Other Ambulatory Visit: Payer: Self-pay

## 2020-06-20 ENCOUNTER — Encounter: Payer: Self-pay | Admitting: Neurology

## 2020-06-20 VITALS — BP 104/74 | HR 87 | Ht 65.0 in | Wt 169.4 lb

## 2020-06-20 DIAGNOSIS — M26622 Arthralgia of left temporomandibular joint: Secondary | ICD-10-CM

## 2020-06-20 DIAGNOSIS — H538 Other visual disturbances: Secondary | ICD-10-CM | POA: Insufficient documentation

## 2020-06-20 DIAGNOSIS — H5702 Anisocoria: Secondary | ICD-10-CM | POA: Diagnosis not present

## 2020-06-20 DIAGNOSIS — R519 Headache, unspecified: Secondary | ICD-10-CM

## 2020-06-20 DIAGNOSIS — R442 Other hallucinations: Secondary | ICD-10-CM | POA: Diagnosis not present

## 2020-06-20 DIAGNOSIS — Z5321 Procedure and treatment not carried out due to patient leaving prior to being seen by health care provider: Secondary | ICD-10-CM | POA: Insufficient documentation

## 2020-06-20 LAB — COMPREHENSIVE METABOLIC PANEL
ALT: 141 U/L — ABNORMAL HIGH (ref 0–35)
AST: 68 U/L — ABNORMAL HIGH (ref 0–37)
Albumin: 4.7 g/dL (ref 3.5–5.2)
Alkaline Phosphatase: 104 U/L (ref 39–117)
BUN: 10 mg/dL (ref 6–23)
CO2: 29 mEq/L (ref 19–32)
Calcium: 10 mg/dL (ref 8.4–10.5)
Chloride: 104 mEq/L (ref 96–112)
Creatinine, Ser: 0.88 mg/dL (ref 0.40–1.20)
GFR: 89.47 mL/min (ref >60.00)
Glucose, Bld: 72 mg/dL (ref 70–99)
Potassium: 4 mEq/L (ref 3.5–5.1)
Sodium: 138 mEq/L (ref 135–145)
Total Bilirubin: 0.4 mg/dL (ref 0.2–1.2)
Total Protein: 7.6 g/dL (ref 6.0–8.3)

## 2020-06-20 LAB — C-REACTIVE PROTEIN: CRP: <1 mg/dL (ref 0.5–20.0)

## 2020-06-20 LAB — CBC WITH DIFFERENTIAL/PLATELET
Basophils Absolute: 0 10*3/uL (ref 0.0–0.1)
Basophils Relative: 0.5 % (ref 0.0–3.0)
Eosinophils Absolute: 0.2 10*3/uL (ref 0.0–0.7)
Eosinophils Relative: 2 % (ref 0.0–5.0)
HCT: 40.1 % (ref 36.0–46.0)
Hemoglobin: 13.6 g/dL (ref 12.0–15.0)
Lymphocytes Relative: 22.5 % (ref 12.0–46.0)
Lymphs Abs: 1.9 10*3/uL (ref 0.7–4.0)
MCHC: 33.9 g/dL (ref 30.0–36.0)
MCV: 83 fl (ref 78.0–100.0)
Monocytes Absolute: 0.6 10*3/uL (ref 0.1–1.0)
Monocytes Relative: 7.4 % (ref 3.0–12.0)
Neutro Abs: 5.8 10*3/uL (ref 1.4–7.7)
Neutrophils Relative %: 67.6 % (ref 43.0–77.0)
Platelets: 322 10*3/uL (ref 150.0–400.0)
RBC: 4.83 Mil/uL (ref 3.87–5.11)
RDW: 13.3 % (ref 11.5–15.5)
WBC: 8.6 10*3/uL (ref 4.0–10.5)

## 2020-06-20 LAB — SEDIMENTATION RATE: Sed Rate: 17 mm/hr (ref 0–20)

## 2020-06-20 NOTE — Progress Notes (Signed)
Labs are back.  Liver enzymes are mildly elevated.  Otherwise labs look normal.  This is reassuring.

## 2020-06-20 NOTE — ED Triage Notes (Signed)
Patient arrived stating she has been on prednisone and finished last Monday, reporting she noticed that her pupils were different sizes. States she has noticed some haziness with her vision with movement. PERRLA in triage. Declines any pain.

## 2020-06-20 NOTE — Progress Notes (Signed)
I, Christoper Fabian, LAT, ATC, am serving as scribe for Dr. Clementeen Graham.  Jaime Leblanc is a 30 y.o. female who presents to [facility] at [facility] today for issues w/ her pupils / eyes and swelling in her face.  She was last seen by Dr. Denyse Amass on 06/06/20 for L TMJ pain and an inability to open her jaw.  Since then, her jaw pain improved w/ the oral prednisone but she began having issues w/ her pupils (uneven size of her pupils) that then progressed to seeing spots in her visual field combined w/ hallucinations since yesterday.  She reports swelling on the R side of her face w/ an enlarged pupil on the R side.  She notes that hallucinations occurred when she woke up from sleep yesterday evening.  They have not happened since she shortly after she woke up.  She has never had hallucinations before.  She feels much better now.   She denies any weakness or numbness or loss of function.  Prednisone ended last week.   Pertinent review of systems: No fevers or chills  Relevant historical information: OCD, history of depression.  History of opiate abuse in remission.  Takes Seroquel at bedtime for insomnia.   Exam:  BP 104/74 (BP Location: Right Arm, Patient Position: Sitting, Cuff Size: Normal)   Pulse 87   Ht 5\' 5"  (1.651 m)   Wt 169 lb 6.4 oz (76.8 kg)   LMP 05/22/2020   SpO2 98%   BMI 28.19 kg/m  General: Well Developed, well nourished, and in no acute distress.   Neuro: Pupils right pupil slightly larger diameter than left.  Both are reactive to light.  The right pupil reacts to light shined in the left pupil and the left reacts to the light shined of the right pupil. Extraocular motion is intact. Temples are nontender bilaterally.  Normal cervical motion.  Upper extremity strength reflexes and sensation are intact. Normal gait and coordination.   Normal balance. Psych: Alert and oriented normal speech thought process and affect.  Patient is not responding to  hallucinations and does not express delusions.    Assessment and Plan: 30 y.o. female with new unequal pupils with history of severe left-sided face pain/jaw pain. This is concerning for facial nerve injury or possibly even a more serious condition such as temporal arteriolitis.  Plan for lab work-up including CBC sed rate CRP and metabolic panel.  Additionally will obtain a head CT scan.  Additionally will refer to neurology.  Clinically patient looks well today so I think with labs and CT scan being normal okay to wait on neurology appointment if not worsening.  Hallucinations: Unclear etiology.  Is possible patient woke up and experiencing some parasomnias.  I do not think these hallucinations are present at onset of new psychiatric illness.  Certainly she does not express psychosis in clinic today.  Reassured patient that people who do not have mental illness can sometimes experience hallucinations especially around sleep.  Recommend that she does take her Seroquel at bedtime as this should help with sleep.  Watchful waiting for this.   PDMP not reviewed this encounter. Orders Placed This Encounter  Procedures  . Ambulatory referral to Neurology    Referral Priority:   Routine    Referral Type:   Consultation    Referral Reason:   Specialty Services Required    Requested Specialty:   Neurology    Number of Visits Requested:   1   No orders  of the defined types were placed in this encounter.    Discussed warning signs or symptoms. Please see discharge instructions. Patient expresses understanding.   The above documentation has been reviewed and is accurate and complete Clementeen Graham, M.D.

## 2020-06-20 NOTE — Patient Instructions (Addendum)
Thank you for coming in today.  Please get labs today before you leave  Wait for the phone call for the CT scan of the head today.   You should hear about neurology referral soon.   You should hear about results of the labs and CT scan mostly tomorrow.   OK to take the Seroquel tonight. Let me know if you have another bad night.

## 2020-06-21 ENCOUNTER — Ambulatory Visit (INDEPENDENT_AMBULATORY_CARE_PROVIDER_SITE_OTHER): Payer: BC Managed Care – PPO

## 2020-06-21 DIAGNOSIS — H5702 Anisocoria: Secondary | ICD-10-CM

## 2020-06-21 DIAGNOSIS — M26622 Arthralgia of left temporomandibular joint: Secondary | ICD-10-CM

## 2020-06-21 DIAGNOSIS — R519 Headache, unspecified: Secondary | ICD-10-CM | POA: Diagnosis not present

## 2020-06-21 NOTE — Progress Notes (Signed)
CT scan of the head looks normal to radiology.

## 2020-07-02 ENCOUNTER — Ambulatory Visit: Payer: BC Managed Care – PPO | Admitting: Physical Therapy

## 2020-07-12 ENCOUNTER — Encounter: Payer: Self-pay | Admitting: Physician Assistant

## 2020-07-12 MED ORDER — FLUOXETINE HCL 60 MG PO TABS: 1.0000 | ORAL_TABLET | Freq: Every day | ORAL | 0 refills | Status: DC

## 2020-08-25 ENCOUNTER — Other Ambulatory Visit: Payer: Self-pay | Admitting: Physician Assistant

## 2020-08-27 NOTE — Progress Notes (Deleted)
NEUROLOGY CONSULTATION NOTE  Alecia Doi MRN: [id number] DOB: ***  Referring provider: Clementeen Graham, MD Primary care provider: Jarold Motto, PA  Reason for consult:  Pupil asymmetry, headache  Assessment/Plan:   1.  Anisocoria  2.  ***   Subjective:  Jaime Leblanc is a 30 year old ***-handed female with depression and OCD and history of opiate dependency in remission who presents for pupil asymmetry and headache.  History supplemented by referring provider's notes.  On 05/30/2020, she developed progressive left jaw pain causing difficulty opening her jaw.  On 06/06/2020, she was diagnosed with left TMJ dysfunction and was prescribed a short course of prednisone, tizanidine and referred to physical therapy.  She takes Seroquel *** at bedtime for insomnia and was advised to increase dose to 100mg  due to possible effects of the prednisone.  She was subsequently started on nortriptyline.  Jaw pain improved but then on 06/15/2020, ***.  Sed rate was 17, CRP negative, CBC normal and CMP unremarkable except for elevated AST 68 and ALT 148.  CT of head without contrast on 06/21/2020 personally reviewed was normal.    Current medications: NSAIDs/analgesics:  Ibuprofen 800mg  Antidepressant:  Nortriptyline 25-50mg  QHS PRN (for pain and sleep); fluoxetine 60mg  daily Antipsychotic:  Quetiapine 100mg  QHS (for sleep) Muscle relaxant:  Tizanidine 4mg  Q6h PRN Antihypertensive:  Propranolol 30mg  TID  PAST MEDICAL HISTORY: Past Medical History:  Diagnosis Date  . Anxiety   . Depression   . Endometriosis 2019  . Fibromyalgia   . Headache   . HPV in female   . Hypertension   . Narcotic abuse in remission (HCC)   . Neuropathy    Left hand and in muscle all over the body  . OCD (obsessive compulsive disorder)   . Optic neuritis    Left Eye  . Panic disorder     PAST SURGICAL HISTORY: Past Surgical History:  Procedure Laterality Date  . LAPAROSCOPY  2019  . WISDOM TOOTH EXTRACTION       MEDICATIONS: Current Outpatient Medications on File Prior to Visit  Medication Sig Dispense Refill  . cetirizine (ZYRTEC ALLERGY) 10 MG tablet Take 1 tablet (10 mg total) by mouth daily. 30 tablet 0  . FLUoxetine HCl 60 MG TABS TAKE 1 TABLET BY MOUTH DAILY 30 tablet 0  . fluticasone (FLONASE) 50 MCG/ACT nasal spray Place 1 spray into both nostrils daily. 16 g 0  . ibuprofen (ADVIL) 800 MG tablet Take 800 mg by mouth every 8 (eight) hours as needed.    . nortriptyline (PAMELOR) 25 MG capsule Take 1-2 capsules (25-50 mg total) by mouth at bedtime as needed for sleep (and pain). 60 capsule 2  . nystatin (MYCOSTATIN) 100000 UNIT/ML suspension Take 5 mLs (500,000 Units total) by mouth 4 (four) times daily. Swish for 30 seconds and swallow. 180 mL 0  . propranolol (INDERAL) 20 MG tablet TAKE 1 AND 1/2 TABLET BY MOUTH THREE TIMES DAILY 405 tablet 0  . QUEtiapine (SEROQUEL) 100 MG tablet Take 1 tablet (100 mg total) by mouth at bedtime. 90 tablet 1  . tiZANidine (ZANAFLEX) 4 MG tablet Take 1 tablet (4 mg total) by mouth every 6 (six) hours as needed for muscle spasms. 30 tablet 1  . [DISCONTINUED] albuterol (VENTOLIN HFA) 108 (90 Base) MCG/ACT inhaler Inhale 1-2 puffs into the lungs every 6 (six) hours as needed for wheezing or shortness of breath. 8 g 0   No current facility-administered medications on file prior to visit.    ALLERGIES:  Allergies  Allergen Reactions  . Azithromycin Itching  . Lamotrigine Rash  . Ondansetron Other (See Comments)    Headaches  . Codeine Itching  . Lidocaine Other (See Comments)    Skin reaction  . Menthol Itching    Menthol in lotion-pt states this makes her skin feel like it is burning  . Topiramate Other (See Comments)    Leg pain  . Tramadol Other (See Comments)    FAMILY HISTORY: Family History  Problem Relation Age of Onset  . Colon cancer Mother 2  . Hypertension Mother   . Depression Mother   . Anxiety disorder Mother   . Hypertension  Father   . Gout Father   . Skin cancer Father   . Kidney cancer Father   . Osteoarthritis Father   . Depression Father   . Skin cancer Maternal Grandmother   . Osteoarthritis Maternal Grandfather   . Diabetes Maternal Grandfather   . COPD Paternal Grandmother   . Heart disease Paternal Grandmother   . Heart attack Paternal Grandfather   . Hyperlipidemia Paternal Grandfather     Objective:  *** General: No acute distress.  Patient appears well-groomed.   Head:  Normocephalic/atraumatic Eyes:  fundi examined but not visualized Neck: supple, no paraspinal tenderness, full range of motion Back: No paraspinal tenderness Heart: regular rate and rhythm Lungs: Clear to auscultation bilaterally. Vascular: No carotid bruits. Neurological Exam: Mental status: alert and oriented to person, place, and time, recent and remote memory intact, fund of knowledge intact, attention and concentration intact, speech fluent and not dysarthric, language intact. Cranial nerves: CN I: not tested CN II: pupils equal, round and reactive to light, visual fields intact CN III, IV, VI:  full range of motion, no nystagmus, no ptosis CN V: facial sensation intact. CN VII: upper and lower face symmetric CN VIII: hearing intact CN IX, X: gag intact, uvula midline CN XI: sternocleidomastoid and trapezius muscles intact CN XII: tongue midline Bulk & Tone: normal, no fasciculations. Motor:  muscle strength 5/5 throughout Sensation:  Pinprick, temperature and vibratory sensation intact. Deep Tendon Reflexes:  2+ throughout,  toes downgoing.   Finger to nose testing:  Without dysmetria.   Heel to shin:  Without dysmetria.   Gait:  Normal station and stride.  Romberg negative.    Thank you for allowing me to take part in the care of this patient.  Shon Millet, DO  CC:  Jarold Motto, [name]  Clementeen Graham, MD

## 2020-08-28 ENCOUNTER — Ambulatory Visit: Payer: BC Managed Care – PPO | Admitting: Neurology

## 2020-08-28 ENCOUNTER — Encounter: Payer: Self-pay | Admitting: Neurology

## 2020-08-28 DIAGNOSIS — Z029 Encounter for administrative examinations, unspecified: Secondary | ICD-10-CM

## 2020-10-19 ENCOUNTER — Telehealth: Payer: BC Managed Care – PPO | Admitting: Physician Assistant

## 2020-10-19 ENCOUNTER — Encounter: Payer: Self-pay | Admitting: Family Medicine

## 2020-10-19 ENCOUNTER — Telehealth (INDEPENDENT_AMBULATORY_CARE_PROVIDER_SITE_OTHER): Payer: BC Managed Care – PPO | Admitting: Family Medicine

## 2020-10-19 DIAGNOSIS — F419 Anxiety disorder, unspecified: Secondary | ICD-10-CM

## 2020-10-19 DIAGNOSIS — Z124 Encounter for screening for malignant neoplasm of cervix: Secondary | ICD-10-CM

## 2020-10-19 DIAGNOSIS — F339 Major depressive disorder, recurrent, unspecified: Secondary | ICD-10-CM

## 2020-10-19 MED ORDER — FLUOXETINE HCL 60 MG PO TABS: 1.0000 | ORAL_TABLET | Freq: Every day | ORAL | 3 refills | Status: DC

## 2020-10-19 MED ORDER — QUETIAPINE FUMARATE 100 MG PO TABS: 200.0000 mg | ORAL_TABLET | Freq: Every day | ORAL | 1 refills | Status: DC

## 2020-10-19 NOTE — Assessment & Plan Note (Signed)
See above problem.  We will increase her Seroquel to 200 mg daily.  Continue fluoxetine 60 mg daily and place referral to psychiatry.

## 2020-10-19 NOTE — Progress Notes (Signed)
   Jaime Leblanc is a 30 y.o. female who presents today for a virtual office visit.  Assessment/Plan:  Chronic Problems Addressed Today: Anxiety Symptoms are worsening but currently stable.  She would like to see a psychiatrist but is worried about her progressively worsening symptoms.  We will continue current doses of fluoxetine 60 mg daily and propranolol 30 mg 3 times daily.  We will increase her dose of Seroquel to 200 mg nightly.  We will place referral to psychiatry.  She will check with me in a couple of weeks to let me know how the Seroquel dose change is working for her.  She is not sure if propranolol has been particularly effective.  May consider weaning off at some point in the future depending on response to Seroquel.  Not a candidate for benzos due to history of substance abuse.  Major depressive disorder, recurrent episode with anxious distress (HCC) See above problem.  We will increase her Seroquel to 200 mg daily.  Continue fluoxetine 60 mg daily and place referral to psychiatry.  Will place referral to OB/GYN for cervical cancer screening.    Subjective:  HPI:  See A/P       Objective/Observations  Physical Exam: Gen: NAD, resting comfortably Pulm: Normal work of breathing Neuro: Grossly normal, moves all extremities Psych: Normal affect and thought content  Virtual Visit via Video   I connected with Izamar Linden on 10/19/20 at 11:40 AM EDT by a video enabled telemedicine application and verified that I am speaking with the correct person using two identifiers. The limitations of evaluation and management by telemedicine and the availability of in person appointments were discussed. The patient expressed understanding and agreed to proceed.   Patient location: Home Provider location: [place] [employer] [facility] Persons participating in the virtual visit: Myself and Patient     Katina Degree. Jimmey Ralph, MD 10/19/2020 9:25 AM

## 2020-10-19 NOTE — Assessment & Plan Note (Addendum)
Symptoms are worsening but currently stable.  She would like to see a psychiatrist but is worried about her progressively worsening symptoms.  We will continue current doses of fluoxetine 60 mg daily and propranolol 30 mg 3 times daily.  We will increase her dose of Seroquel to 200 mg nightly.  We will place referral to psychiatry.  She will check with me in a couple of weeks to let me know how the Seroquel dose change is working for her.  She is not sure if propranolol has been particularly effective.  May consider weaning off at some point in the future depending on response to Seroquel.  Not a candidate for benzos due to history of substance abuse.

## 2020-10-22 ENCOUNTER — Encounter: Payer: Self-pay | Admitting: Family Medicine

## 2020-10-23 NOTE — Telephone Encounter (Signed)
Please advise 

## 2020-10-24 MED ORDER — FLUOXETINE HCL 20 MG PO TABS: 60.0000 mg | ORAL_TABLET | Freq: Every day | ORAL | 2 refills | Status: DC

## 2020-10-26 ENCOUNTER — Telehealth: Payer: Self-pay

## 2020-10-26 ENCOUNTER — Encounter: Payer: Self-pay | Admitting: Family Medicine

## 2020-10-26 NOTE — Telephone Encounter (Signed)
Pt called to speak with Dr. Jimmey Ralph to follow up on her medication changes. Please advise.

## 2020-10-26 NOTE — Telephone Encounter (Signed)
Spoke to pt told her calling in regards to her My Chart message. Told her Dr. Jimmey Ralph  Is out of the office till Tuesday. If you develop chest pain please go to the ED or Urgent care to be evaluated. Also [facility] is [facility] that is open 24/7 if you need to talk to someone or be seen. Address and phone number given to pt. Pt verbalized understanding. Told pt to call back and schedule an appt next week with Dr. Jimmey Ralph to discuss medication. Pt verbalized understanding.

## 2020-10-26 NOTE — Telephone Encounter (Signed)
See MyChart message

## 2020-10-30 ENCOUNTER — Other Ambulatory Visit: Payer: Self-pay | Admitting: Family Medicine

## 2020-10-30 ENCOUNTER — Encounter: Payer: Self-pay | Admitting: Family Medicine

## 2020-10-30 ENCOUNTER — Other Ambulatory Visit: Payer: Self-pay

## 2020-10-30 MED ORDER — CLONIDINE HCL 0.1 MG PO TABS: 0.1000 mg | ORAL_TABLET | Freq: Every day | ORAL | 0 refills | Status: DC

## 2020-10-30 NOTE — Progress Notes (Signed)
Ordered Clonidine.

## 2020-11-07 ENCOUNTER — Other Ambulatory Visit: Payer: Self-pay | Admitting: Family

## 2020-11-07 MED ORDER — FLUOXETINE HCL 20 MG PO CAPS: 60.0000 mg | ORAL_CAPSULE | Freq: Every day | ORAL | 3 refills | Status: DC

## 2020-11-07 NOTE — Telephone Encounter (Signed)
Please advise 

## 2020-11-08 ENCOUNTER — Other Ambulatory Visit: Payer: Self-pay | Admitting: *Deleted

## 2020-11-08 DIAGNOSIS — F4310 Post-traumatic stress disorder, unspecified: Secondary | ICD-10-CM

## 2020-11-08 DIAGNOSIS — F429 Obsessive-compulsive disorder, unspecified: Secondary | ICD-10-CM

## 2020-11-13 ENCOUNTER — Other Ambulatory Visit: Payer: Self-pay

## 2020-11-13 ENCOUNTER — Other Ambulatory Visit: Payer: Self-pay | Admitting: Family Medicine

## 2020-11-13 ENCOUNTER — Other Ambulatory Visit: Payer: Self-pay | Admitting: *Deleted

## 2020-11-13 MED ORDER — CLONIDINE HCL 0.1 MG PO TABS: 0.1000 mg | ORAL_TABLET | Freq: Two times a day (BID) | ORAL | 0 refills | Status: DC

## 2020-11-13 NOTE — Telephone Encounter (Signed)
Rx send to pharmacy  

## 2020-11-13 NOTE — Telephone Encounter (Signed)
Please see message. °

## 2020-11-22 DIAGNOSIS — F3181 Bipolar II disorder: Secondary | ICD-10-CM | POA: Diagnosis not present

## 2020-11-22 DIAGNOSIS — F4310 Post-traumatic stress disorder, unspecified: Secondary | ICD-10-CM | POA: Diagnosis not present

## 2020-11-22 DIAGNOSIS — F419 Anxiety disorder, unspecified: Secondary | ICD-10-CM | POA: Diagnosis not present

## 2020-11-27 DIAGNOSIS — F3181 Bipolar II disorder: Secondary | ICD-10-CM | POA: Diagnosis not present

## 2020-11-28 ENCOUNTER — Other Ambulatory Visit: Payer: Self-pay | Admitting: Family Medicine

## 2020-12-04 ENCOUNTER — Ambulatory Visit: Payer: BC Managed Care – PPO | Admitting: Advanced Practice Midwife

## 2020-12-19 ENCOUNTER — Ambulatory Visit: Payer: BC Managed Care – PPO | Admitting: Family Medicine

## 2020-12-27 DIAGNOSIS — F4310 Post-traumatic stress disorder, unspecified: Secondary | ICD-10-CM | POA: Diagnosis not present

## 2020-12-27 DIAGNOSIS — F419 Anxiety disorder, unspecified: Secondary | ICD-10-CM | POA: Diagnosis not present

## 2020-12-27 DIAGNOSIS — F3181 Bipolar II disorder: Secondary | ICD-10-CM | POA: Diagnosis not present

## 2021-01-08 DIAGNOSIS — F4310 Post-traumatic stress disorder, unspecified: Secondary | ICD-10-CM | POA: Diagnosis not present

## 2021-01-08 DIAGNOSIS — F419 Anxiety disorder, unspecified: Secondary | ICD-10-CM | POA: Diagnosis not present

## 2021-01-08 DIAGNOSIS — F3181 Bipolar II disorder: Secondary | ICD-10-CM | POA: Diagnosis not present

## 2021-01-17 DIAGNOSIS — F3181 Bipolar II disorder: Secondary | ICD-10-CM | POA: Diagnosis not present

## 2021-01-18 ENCOUNTER — Encounter: Payer: Self-pay | Admitting: Family Medicine

## 2021-01-18 DIAGNOSIS — M26622 Arthralgia of left temporomandibular joint: Secondary | ICD-10-CM

## 2021-01-22 MED ORDER — PREDNISONE 50 MG PO TABS: 50.0000 mg | ORAL_TABLET | Freq: Every day | ORAL | 0 refills | Status: DC

## 2021-01-22 MED ORDER — TIZANIDINE HCL 4 MG PO TABS: 4.0000 mg | ORAL_TABLET | Freq: Four times a day (QID) | ORAL | 1 refills | Status: DC | PRN

## 2021-01-25 DIAGNOSIS — F39 Unspecified mood [affective] disorder: Secondary | ICD-10-CM | POA: Diagnosis not present

## 2021-01-29 DIAGNOSIS — F3181 Bipolar II disorder: Secondary | ICD-10-CM | POA: Diagnosis not present

## 2021-01-29 DIAGNOSIS — F4310 Post-traumatic stress disorder, unspecified: Secondary | ICD-10-CM | POA: Diagnosis not present

## 2021-01-29 DIAGNOSIS — F419 Anxiety disorder, unspecified: Secondary | ICD-10-CM | POA: Diagnosis not present

## 2021-02-05 ENCOUNTER — Encounter: Payer: Self-pay | Admitting: Family Medicine

## 2021-02-13 ENCOUNTER — Other Ambulatory Visit: Payer: Self-pay | Admitting: Family Medicine

## 2021-02-14 MED ORDER — PREDNISONE 50 MG PO TABS: 50.0000 mg | ORAL_TABLET | Freq: Every day | ORAL | 0 refills | Status: DC

## 2021-02-14 MED ORDER — TIZANIDINE HCL 4 MG PO TABS
4.0000 mg | ORAL_TABLET | Freq: Four times a day (QID) | ORAL | 1 refills | Status: DC | PRN
Start: 2021-02-14 — End: 2021-03-12

## 2021-02-14 NOTE — Addendum Note (Signed)
Addended by: Rodolph Bong on: 02/14/2021 01:53 PM   Modules accepted: Orders

## 2021-02-18 ENCOUNTER — Ambulatory Visit: Payer: BC Managed Care – PPO | Admitting: Physical Therapy

## 2021-02-21 ENCOUNTER — Emergency Department (HOSPITAL_COMMUNITY): Payer: BC Managed Care – PPO

## 2021-02-21 ENCOUNTER — Other Ambulatory Visit: Payer: Self-pay

## 2021-02-21 ENCOUNTER — Ambulatory Visit: Payer: BC Managed Care – PPO | Attending: Family Medicine | Admitting: Physical Therapy

## 2021-02-21 ENCOUNTER — Emergency Department (HOSPITAL_COMMUNITY)
Admission: EM | Admit: 2021-02-21 | Discharge: 2021-02-21 | Disposition: A | Discharge status: Home / Self Care | Payer: BC Managed Care – PPO | Attending: Emergency Medicine | Admitting: Emergency Medicine

## 2021-02-21 ENCOUNTER — Encounter (HOSPITAL_COMMUNITY): Payer: Self-pay

## 2021-02-21 ENCOUNTER — Encounter: Payer: Self-pay | Admitting: Physician Assistant

## 2021-02-21 DIAGNOSIS — K5900 Constipation, unspecified: Secondary | ICD-10-CM | POA: Diagnosis not present

## 2021-02-21 DIAGNOSIS — Z79899 Other long term (current) drug therapy: Secondary | ICD-10-CM | POA: Diagnosis not present

## 2021-02-21 DIAGNOSIS — Z87891 Personal history of nicotine dependence: Secondary | ICD-10-CM | POA: Diagnosis not present

## 2021-02-21 DIAGNOSIS — K625 Hemorrhage of anus and rectum: Secondary | ICD-10-CM | POA: Diagnosis not present

## 2021-02-21 DIAGNOSIS — R109 Unspecified abdominal pain: Secondary | ICD-10-CM | POA: Diagnosis not present

## 2021-02-21 DIAGNOSIS — N83202 Unspecified ovarian cyst, left side: Secondary | ICD-10-CM | POA: Diagnosis not present

## 2021-02-21 DIAGNOSIS — I10 Essential (primary) hypertension: Secondary | ICD-10-CM | POA: Insufficient documentation

## 2021-02-21 DIAGNOSIS — Z8719 Personal history of other diseases of the digestive system: Secondary | ICD-10-CM | POA: Diagnosis not present

## 2021-02-21 LAB — COMPREHENSIVE METABOLIC PANEL
ALT: 48 U/L — ABNORMAL HIGH (ref 0–44)
AST: 28 U/L (ref 15–41)
Albumin: 4.8 g/dL (ref 3.5–5.0)
Alkaline Phosphatase: 94 U/L (ref 38–126)
Anion gap: 8 (ref 5–15)
BUN: 12 mg/dL (ref 6–20)
CO2: 25 mmol/L (ref 22–32)
Calcium: 10.6 mg/dL — ABNORMAL HIGH (ref 8.9–10.3)
Chloride: 108 mmol/L (ref 98–111)
Creatinine, Ser: 0.88 mg/dL (ref 0.44–1.00)
GFR, Estimated: >60 mL/min (ref >60)
Glucose, Bld: 89 mg/dL (ref 70–99)
Potassium: 4.1 mmol/L (ref 3.5–5.1)
Sodium: 141 mmol/L (ref 135–145)
Total Bilirubin: 0.3 mg/dL (ref 0.3–1.2)
Total Protein: 9 g/dL — ABNORMAL HIGH (ref 6.5–8.1)

## 2021-02-21 LAB — CBC WITH DIFFERENTIAL/PLATELET
Abs Immature Granulocytes: 0.03 10*3/uL (ref 0.00–0.07)
Basophils Absolute: 0.1 10*3/uL (ref 0.0–0.1)
Basophils Relative: 1 %
Eosinophils Absolute: 0.1 10*3/uL (ref 0.0–0.5)
Eosinophils Relative: 1 %
HCT: 41.8 % (ref 36.0–46.0)
Hemoglobin: 13.5 g/dL (ref 12.0–15.0)
Immature Granulocytes: 0 %
Lymphocytes Relative: 18 %
Lymphs Abs: 1.9 10*3/uL (ref 0.7–4.0)
MCH: 26.8 pg (ref 26.0–34.0)
MCHC: 32.3 g/dL (ref 30.0–36.0)
MCV: 82.9 fL (ref 80.0–100.0)
Monocytes Absolute: 0.4 10*3/uL (ref 0.1–1.0)
Monocytes Relative: 4 %
Neutro Abs: 7.8 10*3/uL — ABNORMAL HIGH (ref 1.7–7.7)
Neutrophils Relative %: 76 %
Platelets: 409 10*3/uL — ABNORMAL HIGH (ref 150–400)
RBC: 5.04 MIL/uL (ref 3.87–5.11)
RDW: 13.6 % (ref 11.5–15.5)
WBC: 10.2 10*3/uL (ref 4.0–10.5)
nRBC: 0 % (ref 0.0–0.2)

## 2021-02-21 LAB — TYPE AND SCREEN
ABO/RH(D): B POS
Antibody Screen: NEGATIVE

## 2021-02-21 LAB — I-STAT BETA HCG BLOOD, ED (MC, WL, AP ONLY): I-stat hCG, quantitative: <5 m[IU]/mL (ref <5)

## 2021-02-21 MED ORDER — IOHEXOL 350 MG/ML SOLN
80.0000 mL | Freq: Once | INTRAVENOUS | Status: AC | PRN
  Administered 2021-02-21: 80 mL via INTRAVENOUS

## 2021-02-21 NOTE — ED Triage Notes (Signed)
Pt reports heavy rectal bleeding when having a bowel movement x2 days. She also endorses abdominal cramping. Denies vaginal bleeding and abnormal discharge. Reports nausea with one episode of vomiting yesterday.

## 2021-02-21 NOTE — Discharge Instructions (Signed)
You can try MiraLAX to see if that makes \\your  stool looser and improves the bleeding.  CAT scan today and lab work is all very reassuring.

## 2021-02-21 NOTE — ED Notes (Signed)
Black sweater left in room. Called patient to make aware. VM left

## 2021-02-21 NOTE — ED Notes (Signed)
Pt ambulatory in ED lobby. 

## 2021-02-21 NOTE — ED Provider Notes (Signed)
[facility]-EMERGENCY DEPT Provider Note   CSN: [id number] Arrival date & time: 02/21/21  1133     History Chief Complaint  Patient presents with   Rectal Bleeding    Jaime Leblanc is a 30 y.o. female.  Patient is a 30 year old female with a history of bipolar disease and hypertension who presents today due to bright red blood per rectum.  Patient first noticed symptoms 7 days ago.  She had a bowel movement and there was some bright red blood with a bowel movement.  She did not have a bowel movement on Saturday but then has had a bowel movement every day Sunday through today and every single one of them there is been bright red blood.  It appears that there is more blood with each day.  She has no leaking of blood in between bowel movements.  The blood is bright red and there are clots sometimes.  When it comes out she reports there is some mild stinging but she is not having significant abdominal pain or urinary complaints.  She does have a significant history of her mom having colon cancer at the age of 70 and she was planning on following up with GI at the age of 30 for surveillance.  She has occasionally had blood with her stool in the past related to constipation but nothing consistent.  She is not take any blood thinners.  She intermittently will be constipated but does have a bowel movement every day most days.  The history is provided by the patient.  Rectal Bleeding     Past Medical History:  Diagnosis Date   Anxiety    Depression    Endometriosis 2019   Fibromyalgia    Headache    HPV in female    Hypertension    Narcotic abuse in remission [facility])    Neuropathy    Left hand and in muscle all over the body   OCD (obsessive compulsive disorder)    Optic neuritis    Left Eye   Panic disorder     Patient Active Problem List   Diagnosis Date Noted   Arthralgia of left temporomandibular joint 06/06/2020   PTSD (post-traumatic stress disorder)  09/23/2019   PCOS (polycystic ovarian syndrome) 09/23/2019   Narcotic abuse in remission (HCC)    Endometriosis determined by laparoscopy 09/30/2017   Pelvic pain in female 09/30/2017   Major depressive disorder, recurrent episode with anxious distress (HCC) 06/22/2016   Oral thrush 01/09/2016   OCD (obsessive compulsive disorder) 08/15/2014   Frequent headaches 08/15/2014   Anxiety 07/18/2014    Past Surgical History:  Procedure Laterality Date   LAPAROSCOPY  2019   WISDOM TOOTH EXTRACTION       OB History   No obstetric history on file.     Family History  Problem Relation Age of Onset   Colon cancer Mother 76   Hypertension Mother    Depression Mother    Anxiety disorder Mother    Hypertension Father    Gout Father    Skin cancer Father    Kidney cancer Father    Osteoarthritis Father    Depression Father    Skin cancer Maternal Grandmother    Osteoarthritis Maternal Grandfather    Diabetes Maternal Grandfather    COPD Paternal Grandmother    Heart disease Paternal Grandmother    Heart attack Paternal Grandfather    Hyperlipidemia Paternal Grandfather     Social History   Tobacco Use  Smoking status: Former    Types: Cigarettes    Quit date: 08/15/2010    Years since quitting: 10.5   Smokeless tobacco: Never  Vaping Use   Vaping Use: Every day  Substance Use Topics   Alcohol use: Not Currently   Drug use: No    Home Medications Prior to Admission medications   Medication Sig Start Date End Date Taking? Authorizing Provider  cloNIDine (CATAPRES) 0.1 MG tablet TAKE 1 TABLET(0.1 MG) BY MOUTH TWICE DAILY 11/13/20   Ardith Dark, MD  FLUoxetine (PROZAC) 20 MG capsule Take 3 capsules (60 mg total) by mouth daily. 11/07/20   Eulis Foster, FNP  fluticasone (FLONASE) 50 MCG/ACT nasal spray Place 1 spray into both nostrils daily. 01/05/20   Hall-Potvin, [place], PA-C  predniSONE (DELTASONE) 50 MG tablet Take 1 tablet (50 mg total) by mouth daily. 02/14/21    Rodolph Bong, MD  propranolol (INDERAL) 20 MG tablet TAKE 1 AND 1/2 TABLET BY MOUTH THREE TIMES DAILY 06/15/20   Jarold Motto, PA  QUEtiapine (SEROQUEL) 100 MG tablet TAKE 2 TABLETS(200 MG) BY MOUTH AT BEDTIME 11/29/20   Ardith Dark, MD  tiZANidine (ZANAFLEX) 4 MG tablet Take 1 tablet (4 mg total) by mouth every 6 (six) hours as needed for muscle spasms. 02/14/21   Rodolph Bong, MD  albuterol (VENTOLIN HFA) 108 (90 Base) MCG/ACT inhaler Inhale 1-2 puffs into the lungs every 6 (six) hours as needed for wheezing or shortness of breath. 06/15/19 01/05/20  Belinda Fisher, PA-C    Allergies    Azithromycin, Lamotrigine, Ondansetron, Codeine, Lidocaine, Menthol, Topiramate, and Tramadol  Review of Systems   Review of Systems  Gastrointestinal:  Positive for hematochezia.  All other systems reviewed and are negative.  Physical Exam Updated Vital Signs BP (!) 139/99   Pulse 94   Temp 98.2 F (36.8 C) (Oral)   Resp 20   Ht 5\' 5"  (1.651 m)   Wt 81.6 kg   SpO2 100%   BMI 29.95 kg/m   Physical Exam Vitals and nursing note reviewed.  Constitutional:      General: She is not in acute distress.    Appearance: She is well-developed.  HENT:     Head: Normocephalic and atraumatic.     Mouth/Throat:     Mouth: Mucous membranes are moist.  Eyes:     Pupils: Pupils are equal, round, and reactive to light.  Cardiovascular:     Rate and Rhythm: Normal rate and regular rhythm.     Pulses: Normal pulses.     Heart sounds: Normal heart sounds. No murmur heard.   No friction rub.  Pulmonary:     Effort: Pulmonary effort is normal.     Breath sounds: Normal breath sounds. No wheezing or rales.  Abdominal:     General: Bowel sounds are normal. There is no distension.     Palpations: Abdomen is soft.     Tenderness: There is no abdominal tenderness. There is no guarding or rebound.  Genitourinary:    Rectum: Normal.     Comments: No hemorrhoids or fissures present on rectal exam.  No rectal  mass palpated and no blood on examiner's finger. Musculoskeletal:        General: No tenderness. Normal range of motion.     Cervical back: Normal range of motion and neck supple.     Right lower leg: No edema.     Left lower leg: No edema.  Comments: No edema  Skin:    General: Skin is warm and dry.     Findings: No rash.  Neurological:     Mental Status: She is alert and oriented to person, place, and time. Mental status is at baseline.     Cranial Nerves: No cranial nerve deficit.  Psychiatric:        Mood and Affect: Mood normal.        Behavior: Behavior normal.    ED Results / Procedures / Treatments   Labs (all labs ordered are listed, but only abnormal results are displayed) Labs Reviewed  COMPREHENSIVE METABOLIC PANEL - Abnormal; Notable for the following components:      Result Value   Calcium 10.6 (*)    Total Protein 9.0 (*)    ALT 48 (*)    All other components within normal limits  CBC WITH DIFFERENTIAL/PLATELET - Abnormal; Notable for the following components:   Platelets 409 (*)    Neutro Abs 7.8 (*)    All other components within normal limits  I-STAT BETA HCG BLOOD, ED ([facility], [facility], [facility] ONLY)  POC OCCULT BLOOD, ED  TYPE AND SCREEN  ABO/RH    EKG None  Radiology CT ABDOMEN PELVIS W CONTRAST  Result Date: 02/21/2021 CLINICAL DATA:  History of rectal bleeding EXAM: CT ABDOMEN AND PELVIS WITH CONTRAST TECHNIQUE: Multidetector CT imaging of the abdomen and pelvis was performed using the standard protocol following bolus administration of intravenous contrast. CONTRAST:  2mL OMNIPAQUE IOHEXOL 350 MG/ML SOLN COMPARISON:  None. FINDINGS: Lower chest: No acute abnormality. Hepatobiliary: No focal liver abnormality is seen. No gallstones, gallbladder wall thickening, or biliary dilatation. Pancreas: Unremarkable. No pancreatic ductal dilatation or surrounding inflammatory changes. Spleen: Normal in size without focal abnormality. Adrenals/Urinary Tract: Adrenal  glands are unremarkable. Kidneys are normal, without renal calculi, focal lesion, or hydronephrosis. Bladder is decompressed. Stomach/Bowel: Stomach is within normal limits. Appendix appears normal. No evidence of bowel wall thickening, distention, or inflammatory changes. Vascular/Lymphatic: No significant vascular findings are present. No enlarged abdominal or pelvic lymph nodes. Reproductive: Normal appearance of the uterus. Left adnexal cyst, measuring up to 2.3 cm, likely physiologic. Other: No abdominal wall hernia or abnormality. No abdominopelvic ascites. Musculoskeletal: No acute or significant osseous findings. IMPRESSION: No acute CT findings in the abdomen or pelvis to explain abdominal pain. Electronically Signed   By: Allegra Lai M.D.   On: 02/21/2021 15:15    Procedures Procedures   Medications Ordered in ED Medications  iohexol (OMNIPAQUE) 350 MG/ML injection 80 mL (80 mLs Intravenous Contrast Given 02/21/21 1430)    ED Course  I have reviewed the triage vital signs and the nursing notes.  Pertinent labs & imaging results that were available during my care of the patient were reviewed by me and considered in my medical decision making (see chart for details).    MDM Rules/Calculators/A&P                           Patient presenting with 1 week of bright red blood per rectum with bowel movements.  She has a picture and it is bright red with small clots.  On exam her rectum is normal.  No palpable masses or blood on the finger.  She has no significant abdominal pain.  Patient had lab work done prior to being seen which showed a negative hCG, normal CMP and stable hemoglobin since January 2013.  CT abdomen pelvis was negative for  any masses or lesions.  Discussed with patient importance to follow-up with GI for further evaluation and most likely colonoscopy especially given her mom's history of colon cancer at the age of 83.  MDM   Amount and/or Complexity of Data  Reviewed Clinical lab tests: ordered and reviewed Tests in the radiology section of CPT: ordered and reviewed Independent visualization of images, tracings, or specimens: yes    Final Clinical Impression(s) / ED Diagnoses Final diagnoses:  Rectal bleeding    Rx / DC Orders ED Discharge Orders     None        Gwyneth Sprout, MD 02/21/21 [id number]

## 2021-02-21 NOTE — ED Notes (Signed)
Pt bloodbank bracelet placed on pt.

## 2021-02-21 NOTE — ED Provider Notes (Signed)
Emergency Medicine Provider Triage Evaluation Note  Jaime Leblanc , a 30 y.o. female  was evaluated in triage.  Pt complains of rectal bleeding, nausea, vomiting, and abdominal cramping.  Rectal bleeding x1 week.  Patient reports she is passing nothing but blood when attempting to have bowel movements.  Patient reports cramping sensation to bilateral lower abdomen.  Patient reports nausea and vomiting started yesterday.  Patient had 1 emesis of coffee-ground emesis last night.  Patient is not on any blood thinners.  History of exploratory laparoscopic surgery.  Family history of colon cancer.  LMP 8/28  Additionally patient complains of oral thrush.  Review of Systems  Positive: Rectal bleeding, nausea, vomiting, abdominal cramping Negative: Diarrhea, constipation, melena, fevers, chills, vaginal bleeding, vaginal pain, vaginal discharge, dysuria, hematuria, urinary frequency  Physical Exam  BP 121/87 (BP Location: Left Arm)   Pulse (!) 111   Temp 98.2 F (36.8 C) (Oral)   Resp 20   Ht 5\' 5"  (1.651 m)   Wt 81.6 kg   SpO2 100%   BMI 29.95 kg/m  Gen:   Awake, no distress   Resp:  Normal effort  MSK:   Moves extremities without difficulty  Other:  Abdomen soft, nondistended, nontender, no peritoneal signs.  Medical Decision Making  Medically screening exam initiated at 12:36 PM.  Appropriate orders placed.  Irem Stoneham was informed that the remainder of the evaluation will be completed by another provider, this initial triage assessment does not replace that evaluation, and the importance of remaining in the ED until their evaluation is complete.  The patient appears stable so that the remainder of the work up may be completed by another provider.      Barbaraann Rondo, PA-C 02/21/21 1238    02/23/21, MD 02/26/21 1054

## 2021-02-21 NOTE — Telephone Encounter (Signed)
Nurse Assessment Nurse: Lorenz Coaster, RN, Sean Date/Time (Eastern Time): 02/21/2021 10:30:42 AM Confirm and document reason for call. If symptomatic, describe symptoms. ---Caller states having bright red blood stools with clots, denies black or tarry bowel movements. Has had constipation. Abdominal pain that feel like period cramps, pain is uncomfortable, when is stabbing pain goes up to 8 out of 10. Denies fever today, did have one yesterday. Yesterday nausea and vomiting, has been very tired for 1.5 months now. Ibuprofen and Pepto Bismol for symptoms. Does the patient have any new or worsening symptoms? ---Yes Will a triage be completed? ---Yes Related visit to physician within the last 2 weeks? ---No Does the PT have any chronic conditions? (i.e. diabetes, asthma, this includes High risk factors for pregnancy, etc.) ---Yes List chronic conditions. ---Depression, Bipolar. Is the patient pregnant or possibly pregnant? (Ask all females between the ages of 20-55) ---No Is this a behavioral health or substance abuse call? ---No Guidelines Guideline Title Affirmed Question Affirmed Notes Nurse Date/Time Lamount Cohen Time) Rectal Bleeding [1] MODERATE rectal bleeding (small Baxter, RN, Gregary Signs 02/21/2021 10:35:12 AM PLEASE NOTE: All timestamps contained within this report are represented as [place] Standard Time. CONFIDENTIALTY NOTICE: This fax transmission is intended only for the addressee. It contains information that is legally privileged, confidential or otherwise protected from use or disclosure. If you are not the intended recipient, you are strictly prohibited from reviewing, disclosing, copying using or disseminating any of this information or taking any action in reliance on or regarding this information. If you have received this fax in error, please notify us immediately by telephone so that we can arrange for its return to [place]. Phone: [phone], Toll-Free: [phone], Fax:  [phone] Page: 2 of 2 Call Id: [id number] Guidelines Guideline Title Affirmed Question Affirmed Notes Nurse Date/Time Lamount Cohen Time) blood clots, passing blood without stool, or toilet water turns red) AND [2] more than once a day Disp. Time Lamount Cohen Time) Disposition Final User 02/21/2021 10:38:49 AM Go to ED Now Yes Baxter, RN, Marcelo Baldy Disagree/Comply Comply Caller Understands Yes PreDisposition InappropriateToAsk Care Advice Given Per Guideline GO TO ED NOW: CARE ADVICE given per Rectal Bleeding (Adult) guideline. Referrals GO TO FACILITY OTHER - SPECIFY

## 2021-02-22 NOTE — Telephone Encounter (Signed)
Pt went to ED

## 2021-03-01 DIAGNOSIS — F419 Anxiety disorder, unspecified: Secondary | ICD-10-CM | POA: Diagnosis not present

## 2021-03-01 DIAGNOSIS — F3181 Bipolar II disorder: Secondary | ICD-10-CM | POA: Diagnosis not present

## 2021-03-01 DIAGNOSIS — F4310 Post-traumatic stress disorder, unspecified: Secondary | ICD-10-CM | POA: Diagnosis not present

## 2021-03-03 ENCOUNTER — Other Ambulatory Visit: Payer: Self-pay | Admitting: Physician Assistant

## 2021-03-05 ENCOUNTER — Telehealth: Payer: BC Managed Care – PPO | Admitting: Physician Assistant

## 2021-03-05 NOTE — Progress Notes (Signed)
Patient did not show for appointment Was not seen by me No charge  Jarold Motto PA-C

## 2021-03-06 ENCOUNTER — Encounter: Payer: Self-pay | Admitting: Physician Assistant

## 2021-03-11 ENCOUNTER — Other Ambulatory Visit: Payer: Self-pay | Admitting: Family Medicine

## 2021-03-12 NOTE — Telephone Encounter (Signed)
Rx refill request approved per Dr. Corey's orders. 

## 2021-03-25 ENCOUNTER — Other Ambulatory Visit: Payer: Self-pay | Admitting: Family Medicine

## 2021-04-02 DIAGNOSIS — F4310 Post-traumatic stress disorder, unspecified: Secondary | ICD-10-CM | POA: Diagnosis not present

## 2021-04-02 DIAGNOSIS — F419 Anxiety disorder, unspecified: Secondary | ICD-10-CM | POA: Diagnosis not present

## 2021-04-02 DIAGNOSIS — F3181 Bipolar II disorder: Secondary | ICD-10-CM | POA: Diagnosis not present

## 2021-04-30 ENCOUNTER — Other Ambulatory Visit: Payer: Self-pay | Admitting: Family Medicine

## 2021-05-02 DIAGNOSIS — F3181 Bipolar II disorder: Secondary | ICD-10-CM | POA: Diagnosis not present

## 2021-05-02 DIAGNOSIS — F4310 Post-traumatic stress disorder, unspecified: Secondary | ICD-10-CM | POA: Diagnosis not present

## 2021-05-02 DIAGNOSIS — F419 Anxiety disorder, unspecified: Secondary | ICD-10-CM | POA: Diagnosis not present

## 2021-05-16 ENCOUNTER — Other Ambulatory Visit: Payer: Self-pay | Admitting: Family Medicine

## 2021-05-16 NOTE — Telephone Encounter (Signed)
Rx refill request approved per Dr. Corey's orders. 

## 2021-05-29 ENCOUNTER — Other Ambulatory Visit: Payer: Self-pay | Admitting: Family Medicine

## 2021-06-16 ENCOUNTER — Other Ambulatory Visit: Payer: Self-pay | Admitting: Family Medicine

## 2021-06-30 ENCOUNTER — Other Ambulatory Visit: Payer: Self-pay | Admitting: Family Medicine

## 2021-07-07 ENCOUNTER — Other Ambulatory Visit: Payer: Self-pay | Admitting: Family Medicine

## 2021-08-01 ENCOUNTER — Other Ambulatory Visit: Payer: Self-pay | Admitting: Family Medicine

## 2021-08-19 ENCOUNTER — Other Ambulatory Visit: Payer: Self-pay | Admitting: Family Medicine

## 2021-08-23 ENCOUNTER — Other Ambulatory Visit: Payer: Self-pay

## 2021-08-23 ENCOUNTER — Ambulatory Visit
Admission: RE | Admit: 2021-08-23 | Discharge: 2021-08-23 | Disposition: A | Discharge status: Home / Self Care | Payer: Self-pay | Source: Ambulatory Visit | Attending: Emergency Medicine | Admitting: Emergency Medicine

## 2021-08-23 VITALS — BP 144/84 | HR 120 | Temp 98.2°F | Resp 18

## 2021-08-23 DIAGNOSIS — J020 Streptococcal pharyngitis: Secondary | ICD-10-CM

## 2021-08-23 DIAGNOSIS — R319 Hematuria, unspecified: Secondary | ICD-10-CM

## 2021-08-23 DIAGNOSIS — R112 Nausea with vomiting, unspecified: Secondary | ICD-10-CM

## 2021-08-23 DIAGNOSIS — N390 Urinary tract infection, site not specified: Secondary | ICD-10-CM

## 2021-08-23 LAB — POCT URINALYSIS DIP (MANUAL ENTRY)
Glucose, UA: NEGATIVE mg/dL
Nitrite, UA: POSITIVE — AB
Protein Ur, POC: 30 mg/dL — AB
Spec Grav, UA: >=1.03 — AB (ref 1.010–1.025)
Urobilinogen, UA: 0.2 E.U./dL
pH, UA: 5.5 (ref 5.0–8.0)

## 2021-08-23 LAB — POCT URINE PREGNANCY: Preg Test, Ur: NEGATIVE

## 2021-08-23 LAB — POCT RAPID STREP A (OFFICE): Rapid Strep A Screen: POSITIVE — AB

## 2021-08-23 MED ORDER — CEPHALEXIN 500 MG PO CAPS: 500.0000 mg | ORAL_CAPSULE | Freq: Four times a day (QID) | ORAL | 0 refills | Status: DC

## 2021-08-23 NOTE — ED Provider Notes (Signed)
?[facility] ? ? ? ?CSN: [id number] ?Arrival date & time: 08/23/21  6387 ? ? ?  ? ?History   ?Chief Complaint ?Chief Complaint  ?Patient presents with  ? Sore Throat  ?  Also I think I have a UTI - Entered by patient  ? Nausea  ? Back Pain  ? ? ?HPI ?Jaime Leblanc is a 31 y.o. female.  Patient presents with chills, sore throat, mild occasional nonproductive cough, nausea, vomiting, low back pain x3 days.  No emesis today but 4 episodes yesterday.  No fever, rash, shortness of breath, diarrhea, or other symptoms.  Treatment at home with Tylenol.  She is concerned for possible UTI.  Her medical history includes hypertension, neuropathy, fibromyalgia, anxiety, depression, OCD, endometriosis, headaches, narcotic abuse in remission. ? ?The history is provided by the patient and medical records.  ? ?Past Medical History:  ?Diagnosis Date  ? Anxiety   ? Depression   ? Endometriosis 2019  ? Fibromyalgia   ? Headache   ? HPV in female   ? Hypertension   ? Narcotic abuse in remission [facility])   ? Neuropathy   ? Left hand and in muscle all over the body  ? OCD (obsessive compulsive disorder)   ? Optic neuritis   ? Left Eye  ? Panic disorder   ? ? ?Patient Active Problem List  ? Diagnosis Date Noted  ? Arthralgia of left temporomandibular joint 06/06/2020  ? PTSD (post-traumatic stress disorder) 09/23/2019  ? PCOS (polycystic ovarian syndrome) 09/23/2019  ? Narcotic abuse in remission [facility]   ? Endometriosis determined by laparoscopy 09/30/2017  ? Pelvic pain in female 09/30/2017  ? Major depressive disorder, recurrent episode with anxious distress (HCC) 06/22/2016  ? Oral thrush 01/09/2016  ? OCD (obsessive compulsive disorder) 08/15/2014  ? Frequent headaches 08/15/2014  ? Anxiety 07/18/2014  ? ? ?Past Surgical History:  ?Procedure Laterality Date  ? LAPAROSCOPY  2019  ? WISDOM TOOTH EXTRACTION    ? ? ?OB History   ?No obstetric history on file. ?  ? ? ? ?Home Medications   ? ?Prior to Admission medications   ?Medication  Sig Start Date End Date Taking? Authorizing Provider  ?ARIPiprazole (ABILIFY) 5 MG tablet Take 7.5 mg by mouth daily. 05/02/21  Yes [provider]  ?cephALEXin (KEFLEX) 500 MG capsule Take 1 capsule (500 mg total) by mouth 4 (four) times daily. 08/23/21  Yes Mickie Bail, NP  ?cloNIDine (CATAPRES) 0.1 MG tablet TAKE 1 TABLET(0.1 MG) BY MOUTH TWICE DAILY 11/13/20  Yes Ardith Dark, MD  ?fluticasone (FLONASE) 50 MCG/ACT nasal spray Place 1 spray into both nostrils daily. 01/05/20  Yes Hall-Potvin, [place], PA-C  ?QUEtiapine (SEROQUEL) 100 MG tablet TAKE 2 TABLETS(200 MG) BY MOUTH AT BEDTIME 11/29/20  Yes Ardith Dark, MD  ?tiZANidine (ZANAFLEX) 4 MG tablet TAKE 1 TABLET(4 MG) BY MOUTH EVERY 6 HOURS AS NEEDED FOR MUSCLE SPASMS 08/20/21  Yes Rodolph Bong, MD  ?FLUoxetine (PROZAC) 20 MG capsule Take 3 capsules (60 mg total) by mouth daily. 11/07/20   Eulis Foster, FNP  ?predniSONE (DELTASONE) 50 MG tablet Take 1 tablet (50 mg total) by mouth daily. 02/14/21   Rodolph Bong, MD  ?propranolol (INDERAL) 20 MG tablet TAKE 1 AND 1/2 TABLET BY MOUTH THREE TIMES DAILY 06/15/20   Jarold Motto, PA  ?albuterol (VENTOLIN HFA) 108 (90 Base) MCG/ACT inhaler Inhale 1-2 puffs into the lungs every 6 (six) hours as needed for wheezing or shortness of  breath. 06/15/19 01/05/20  Belinda FisherYu, Amy V, PA-C  ? ? ?Family History ?Family History  ?Problem Relation Age of Onset  ? Colon cancer Mother [id number]  ? Hypertension Mother   ? Depression Mother   ? Anxiety disorder Mother   ? Hypertension Father   ? Gout Father   ? Skin cancer Father   ? Kidney cancer Father   ? Osteoarthritis Father   ? Depression Father   ? Skin cancer Maternal Grandmother   ? Osteoarthritis Maternal Grandfather   ? Diabetes Maternal Grandfather   ? COPD Paternal Grandmother   ? Heart disease Paternal Grandmother   ? Heart attack Paternal Grandfather   ? Hyperlipidemia Paternal Grandfather   ? ? ?Social History ?Social History  ? ?Tobacco Use  ? Smoking status: Former  ?   Types: Cigarettes  ?  Quit date: 08/15/2010  ?  Years since quitting: 11.0  ? Smokeless tobacco: Never  ?Vaping Use  ? Vaping Use: Every day  ?Substance Use Topics  ? Alcohol use: Not Currently  ? Drug use: No  ? ? ? ?Allergies   ?Azithromycin, Lamotrigine, Ondansetron, Codeine, Levaquin [levofloxacin], Lidocaine, Menthol, Topiramate, and Tramadol ? ? ?Review of Systems ?Review of Systems  ?Constitutional:  Positive for chills. Negative for fever.  ?HENT:  Positive for sore throat. Negative for ear pain.   ?Respiratory:  Positive for cough. Negative for shortness of breath.   ?Cardiovascular:  Negative for chest pain and palpitations.  ?Gastrointestinal:  Positive for nausea and vomiting. Negative for abdominal pain and diarrhea.  ?Genitourinary:  Positive for dysuria. Negative for hematuria.  ?Musculoskeletal:  Positive for back pain. Negative for gait problem.  ?Skin:  Negative for color change and rash.  ?All other systems reviewed and are negative. ? ? ?Physical Exam ?Triage Vital Signs ?ED Triage Vitals  ?Enc Vitals Group  ?   BP   ?   Pulse   ?   Resp   ?   Temp   ?   Temp src   ?   SpO2   ?   Weight   ?   Height   ?   Head Circumference   ?   Peak Flow   ?   Pain Score   ?   Pain Loc   ?   Pain Edu?   ?   Excl. in [place]?   ? ?No data found. ? ?Updated Vital Signs ?BP (!) 144/84   Pulse (!) 120   Temp 98.2 ?F (36.8 ?C)   Resp 18   LMP 08/09/2021 (Approximate)   SpO2 97%  ? ?Visual Acuity ?Right Eye Distance:   ?Left Eye Distance:   ?Bilateral Distance:   ? ?Right Eye Near:   ?Left Eye Near:    ?Bilateral Near:    ? ?Physical Exam ?Vitals and nursing note reviewed.  ?Constitutional:   ?   General: She is not in acute distress. ?   Appearance: She is well-developed. She is not ill-appearing.  ?HENT:  ?   Right Ear: Tympanic membrane normal.  ?   Left Ear: Tympanic membrane normal.  ?   Nose: Nose normal.  ?   Mouth/Throat:  ?   Mouth: Mucous membranes are moist.  ?   Pharynx: Posterior oropharyngeal erythema  present.  ?Cardiovascular:  ?   Rate and Rhythm: Normal rate and regular rhythm.  ?   Heart sounds: Normal heart sounds.  ?Pulmonary:  ?   Effort: Pulmonary effort is normal. No  respiratory distress.  ?   Breath sounds: Normal breath sounds.  ?Abdominal:  ?   General: Bowel sounds are normal.  ?   Palpations: Abdomen is soft.  ?   Tenderness: There is no abdominal tenderness. There is no right CVA tenderness, left CVA tenderness, guarding or rebound.  ?Musculoskeletal:  ?   Cervical back: Neck supple.  ?Skin: ?   General: Skin is warm and dry.  ?Neurological:  ?   Mental Status: She is alert.  ?Psychiatric:     ?   Mood and Affect: Mood normal.     ?   Behavior: Behavior normal.  ? ? ? ?UC Treatments / Results  ?Labs ?(all labs ordered are listed, but only abnormal results are displayed) ?Labs Reviewed  ?POCT RAPID STREP A (OFFICE) - Abnormal; Notable for the following components:  ?    Result Value  ? Rapid Strep A Screen Positive (*)   ? All other components within normal limits  ?POCT URINALYSIS DIP (MANUAL ENTRY) - Abnormal; Notable for the following components:  ? Clarity, UA cloudy (*)   ? Bilirubin, UA small (*)   ? Ketones, POC UA moderate (40) (*)   ? Spec Grav, UA >=1.030 (*)   ? Blood, UA moderate (*)   ? Protein Ur, POC =30 (*)   ? Nitrite, UA Positive (*)   ? Leukocytes, UA Trace (*)   ? All other components within normal limits  ?URINE CULTURE  ?POCT URINE PREGNANCY  ? ? ?EKG ? ? ?Radiology ?No results found. ? ?Procedures ?Procedures (including critical care time) ? ?Medications Ordered in UC ?Medications - No data to display ? ?Initial Impression / Assessment and Plan / UC Course  ?I have reviewed the triage vital signs and the nursing notes. ? ?Pertinent labs & imaging results that were available during my care of the patient were reviewed by me and considered in my medical decision making (see chart for details). ? ? UTI. Strep pharyngitis. Nausea and vomiting.  ?Patient has a urinary tract  infection and strep throat.  Treating with cephalexin.  Urine culture pending.  Discussed Tylenol or ibuprofen as needed for discomfort.  She is allergic to Zofran and states she will take OTC medicine for the na

## 2021-08-23 NOTE — Discharge Instructions (Addendum)
You have strep throat.  You also have a UTI.  Take the antibiotic as directed.  See the attached information.  Follow up with your primary care provider if your symptoms are not improving.   ? ? ?

## 2021-08-23 NOTE — ED Triage Notes (Signed)
Pt presents with ST and nausea x 3 days. She also has back pain and wants her urine checked x 2 days  ?

## 2021-08-25 LAB — URINE CULTURE: Culture: 70000 — AB

## 2021-09-01 ENCOUNTER — Other Ambulatory Visit: Payer: Self-pay | Admitting: Family Medicine

## 2021-09-12 DIAGNOSIS — F419 Anxiety disorder, unspecified: Secondary | ICD-10-CM | POA: Diagnosis not present

## 2021-09-12 DIAGNOSIS — F4310 Post-traumatic stress disorder, unspecified: Secondary | ICD-10-CM | POA: Diagnosis not present

## 2021-09-12 DIAGNOSIS — F3181 Bipolar II disorder: Secondary | ICD-10-CM | POA: Diagnosis not present

## 2021-09-18 ENCOUNTER — Other Ambulatory Visit: Payer: Self-pay | Admitting: Family Medicine

## 2021-09-18 NOTE — Telephone Encounter (Signed)
Rx refill request approved per Dr. Corey's orders. 

## 2021-10-07 ENCOUNTER — Other Ambulatory Visit: Payer: Self-pay | Admitting: Family Medicine

## 2021-10-10 DIAGNOSIS — F4310 Post-traumatic stress disorder, unspecified: Secondary | ICD-10-CM | POA: Diagnosis not present

## 2021-10-10 DIAGNOSIS — F3181 Bipolar II disorder: Secondary | ICD-10-CM | POA: Diagnosis not present

## 2021-10-10 DIAGNOSIS — F419 Anxiety disorder, unspecified: Secondary | ICD-10-CM | POA: Diagnosis not present

## 2021-11-04 ENCOUNTER — Other Ambulatory Visit: Payer: Self-pay | Admitting: *Deleted

## 2021-11-04 MED ORDER — QUETIAPINE FUMARATE 100 MG PO TABS: ORAL_TABLET | ORAL | 0 refills | Status: AC

## 2021-12-12 DIAGNOSIS — F3181 Bipolar II disorder: Secondary | ICD-10-CM | POA: Diagnosis not present

## 2021-12-12 DIAGNOSIS — F419 Anxiety disorder, unspecified: Secondary | ICD-10-CM | POA: Diagnosis not present

## 2021-12-12 DIAGNOSIS — F4310 Post-traumatic stress disorder, unspecified: Secondary | ICD-10-CM | POA: Diagnosis not present

## 2021-12-23 DIAGNOSIS — Z8742 Personal history of other diseases of the female genital tract: Secondary | ICD-10-CM | POA: Diagnosis not present

## 2021-12-23 DIAGNOSIS — R109 Unspecified abdominal pain: Secondary | ICD-10-CM | POA: Diagnosis not present

## 2022-02-24 ENCOUNTER — Encounter: Payer: Self-pay | Admitting: *Deleted

## 2022-03-10 ENCOUNTER — Ambulatory Visit
Admission: EM | Admit: 2022-03-10 | Discharge: 2022-03-10 | Disposition: A | Discharge status: Home / Self Care | Payer: BC Managed Care – PPO | Attending: Family Medicine | Admitting: Family Medicine

## 2022-03-10 DIAGNOSIS — H00012 Hordeolum externum right lower eyelid: Secondary | ICD-10-CM | POA: Diagnosis not present

## 2022-03-10 DIAGNOSIS — R52 Pain, unspecified: Secondary | ICD-10-CM | POA: Diagnosis not present

## 2022-03-10 DIAGNOSIS — R300 Dysuria: Secondary | ICD-10-CM | POA: Diagnosis not present

## 2022-03-10 LAB — POCT URINALYSIS DIP (MANUAL ENTRY)
Bilirubin, UA: NEGATIVE
Glucose, UA: NEGATIVE mg/dL
Ketones, POC UA: NEGATIVE mg/dL
Nitrite, UA: NEGATIVE
Protein Ur, POC: NEGATIVE mg/dL
Spec Grav, UA: 1.015 (ref 1.010–1.025)
Urobilinogen, UA: 0.2 E.U./dL
pH, UA: 5.5 (ref 5.0–8.0)

## 2022-03-10 MED ORDER — POLYMYXIN B-TRIMETHOPRIM 10000-0.1 UNIT/ML-% OP SOLN: 1.0000 [drp] | Freq: Four times a day (QID) | OPHTHALMIC | 0 refills | Status: DC

## 2022-03-10 NOTE — ED Triage Notes (Signed)
Pt reports 3 months ago started developing styes. Cleared up 2 days ago afraid it is going to occur again. Reports feeling fatigue and body aches for a month. Pt says she has a sore throat since yesterday morning. Burning when she urinates since last night.

## 2022-03-10 NOTE — ED Provider Notes (Signed)
[facility]    CSN: [id number] Arrival date & time: 03/10/22  0946      History   Chief Complaint No chief complaint on file.   HPI Jaime Leblanc is a 31 y.o. female.   Patient presenting today with several month history of recurring styes to the right lower eyelid.  She is just now getting over 1 as of the last day or so to this area.  States she cleans her eyes every day, washes her hands regularly, has stopped using make-up altogether but before this tried buying all new make-up.  She states she also started with a sore throat yesterday morning and has had body aches, fatigue and feeling overall worn out for the past month.  She does note that she works 6 days a week, 12-hour shifts.  No known sick contacts recently.  Not trying anything over-the-counter for symptoms.  She also had some dysuria last night when she used the restroom.  Denies fever, flank pain, vaginal symptoms, nausea, vomiting.    Past Medical History:  Diagnosis Date   Anxiety    Depression    Endometriosis 2019   Fibromyalgia    Headache    HPV in female    Hypertension    Narcotic abuse in remission [facility])    Neuropathy    Left hand and in muscle all over the body   OCD (obsessive compulsive disorder)    Optic neuritis    Left Eye   Panic disorder     Patient Active Problem List   Diagnosis Date Noted   Arthralgia of left temporomandibular joint 06/06/2020   PTSD (post-traumatic stress disorder) 09/23/2019   PCOS (polycystic ovarian syndrome) 09/23/2019   Narcotic abuse in remission (HCC)    Endometriosis determined by laparoscopy 09/30/2017   Pelvic pain in female 09/30/2017   Major depressive disorder, recurrent episode with anxious distress (HCC) 06/22/2016   Oral thrush 01/09/2016   OCD (obsessive compulsive disorder) 08/15/2014   Frequent headaches 08/15/2014   Anxiety 07/18/2014    Past Surgical History:  Procedure Laterality Date   LAPAROSCOPY  2019   WISDOM TOOTH  EXTRACTION      OB History   No obstetric history on file.      Home Medications    Prior to Admission medications   Medication Sig Start Date End Date Taking? Authorizing Provider  trimethoprim-polymyxin b (POLYTRIM) ophthalmic solution Place 1 drop into the right eye every 6 (six) hours. 03/10/22  Yes Particia Nearing, PA-C  ARIPiprazole (ABILIFY) 5 MG tablet Take 7.5 mg by mouth daily. 05/02/21   [provider]  cephALEXin (KEFLEX) 500 MG capsule Take 1 capsule (500 mg total) by mouth 4 (four) times daily. 08/23/21   Mickie Bail, NP  cloNIDine (CATAPRES) 0.1 MG tablet TAKE 1 TABLET(0.1 MG) BY MOUTH TWICE DAILY 11/13/20   Ardith Dark, MD  FLUoxetine (PROZAC) 20 MG capsule Take 3 capsules (60 mg total) by mouth daily. 11/07/20   Eulis Foster, FNP  fluticasone (FLONASE) 50 MCG/ACT nasal spray Place 1 spray into both nostrils daily. 01/05/20   Hall-Potvin, [place], PA-C  predniSONE (DELTASONE) 50 MG tablet Take 1 tablet (50 mg total) by mouth daily. 02/14/21   Rodolph Bong, MD  propranolol (INDERAL) 20 MG tablet TAKE 1 AND 1/2 TABLET BY MOUTH THREE TIMES DAILY 06/15/20   Jarold Motto, PA  QUEtiapine (SEROQUEL) 100 MG tablet TAKE 2 TABLETS(200 MG) BY MOUTH AT BEDTIME 11/04/21   Jarold Motto,  PA  tiZANidine (ZANAFLEX) 4 MG tablet TAKE 1 TABLET(4 MG) BY MOUTH EVERY 6 HOURS AS NEEDED FOR MUSCLE SPASMS 09/18/21   Gregor Hams, MD  albuterol (VENTOLIN HFA) 108 (90 Base) MCG/ACT inhaler Inhale 1-2 puffs into the lungs every 6 (six) hours as needed for wheezing or shortness of breath. 06/15/19 01/05/20  Ok Edwards, PA-C    Family History Family History  Problem Relation Age of Onset   Colon cancer Mother 4   Hypertension Mother    Depression Mother    Anxiety disorder Mother    Hypertension Father    Gout Father    Skin cancer Father    Kidney cancer Father    Osteoarthritis Father    Depression Father    Skin cancer Maternal Grandmother    Osteoarthritis Maternal  Grandfather    Diabetes Maternal Grandfather    COPD Paternal Grandmother    Heart disease Paternal Grandmother    Heart attack Paternal Grandfather    Hyperlipidemia Paternal Grandfather     Social History Social History   Tobacco Use   Smoking status: Former    Types: Cigarettes    Quit date: 08/15/2010    Years since quitting: 11.5   Smokeless tobacco: Never  Vaping Use   Vaping Use: Every day  Substance Use Topics   Alcohol use: Not Currently   Drug use: No     Allergies   Azithromycin, Lamotrigine, Ondansetron, Codeine, Levaquin [levofloxacin], Lidocaine, Menthol, Topiramate, and Tramadol   Review of Systems Review of Systems PER HPI  Physical Exam Triage Vital Signs ED Triage Vitals [03/10/22 1125]  Enc Vitals Group     BP 117/76     Pulse Rate 81     Resp 18     Temp 98 F (36.7 C)     Temp Source Oral     SpO2 98 %     Weight      Height      Head Circumference      Peak Flow      Pain Score      Pain Loc      Pain Edu?      Excl. in [place]?    No data found.  Updated Vital Signs BP 117/76 (BP Location: Right Arm)   Pulse 81   Temp 98 F (36.7 C) (Oral)   Resp 18   LMP 02/22/2022 (Approximate)   SpO2 98%   Visual Acuity Right Eye Distance:   Left Eye Distance:   Bilateral Distance:    Right Eye Near:   Left Eye Near:    Bilateral Near:     Physical Exam Vitals and nursing note reviewed.  Constitutional:      Appearance: Normal appearance. She is not ill-appearing.  HENT:     Head: Atraumatic.     Nose: Nose normal.     Mouth/Throat:     Mouth: Mucous membranes are moist.     Pharynx: Oropharynx is clear. No oropharyngeal exudate or posterior oropharyngeal erythema.  Eyes:     Extraocular Movements: Extraocular movements intact.     Conjunctiva/sclera: Conjunctivae normal.     Pupils: Pupils are equal, round, and reactive to light.     Comments: Mild area of erythema, edema to the right medial lower lash line where the stye has  just resolved per patient  Cardiovascular:     Rate and Rhythm: Normal rate and regular rhythm.     Heart sounds: Normal heart sounds.  Pulmonary:     Effort: Pulmonary effort is normal.     Breath sounds: Normal breath sounds.  Abdominal:     General: Bowel sounds are normal. There is no distension.     Palpations: Abdomen is soft.     Tenderness: There is no abdominal tenderness. There is no right CVA tenderness, left CVA tenderness or guarding.  Musculoskeletal:        General: Normal range of motion.     Cervical back: Normal range of motion and neck supple.  Lymphadenopathy:     Cervical: No cervical adenopathy.  Skin:    General: Skin is warm and dry.  Neurological:     General: No focal deficit present.     Mental Status: She is alert and oriented to person, place, and time.     Motor: No weakness.     Gait: Gait normal.  Psychiatric:        Mood and Affect: Mood normal.        Thought Content: Thought content normal.        Judgment: Judgment normal.    UC Treatments / Results  Labs (all labs ordered are listed, but only abnormal results are displayed) Labs Reviewed  POCT URINALYSIS DIP (MANUAL ENTRY) - Abnormal; Notable for the following components:      Result Value   Blood, UA trace-intact (*)    Leukocytes, UA Trace (*)    All other components within normal limits  URINE CULTURE  CBC WITH DIFFERENTIAL/PLATELET  COMPREHENSIVE METABOLIC PANEL    EKG  Radiology No results found.  Procedures Procedures (including critical care time)  Medications Ordered in UC Medications - No data to display  Initial Impression / Assessment and Plan / UC Course  I have reviewed the triage vital signs and the nursing notes.  Pertinent labs & imaging results that were available during my care of the patient were reviewed by me and considered in my medical decision making (see chart for details).     We will treat recurring styes with Polytrim drops as needed, warm  compresses and continued avoidance of make-up or irritating facial products.  Follow-up with ophthalmology if continuing as it may be more of a structural issue than an exposure issue if continuing.  Regarding her dysuria, trace leuks on urinalysis today, very low suspicion for UTI but will send out for urine culture to rule this out.  Drink plenty of fluids.  Regarding her fatigue and generalized body aches x1 month, discussed that this ultimately would need to be worked up further by her primary care provider but in the meantime will send out for CBC and CMP for safety check.  Discussed importance of rest, good nutrition, good hydration.  She is requesting a work note for the next 3 days, this was given.  Return for any worsening symptoms.  Final Clinical Impressions(s) / UC Diagnoses   Final diagnoses:  Dysuria  Hordeolum externum of right lower eyelid  Generalized body aches   Discharge Instructions   None    ED Prescriptions     Medication Sig Dispense Auth. Provider   trimethoprim-polymyxin b (POLYTRIM) ophthalmic solution Place 1 drop into the right eye every 6 (six) hours. 10 mL Particia Nearing, [place]      PDMP not reviewed this encounter.   Particia Nearing, [place] 03/10/22 1421

## 2022-03-11 LAB — CBC WITH DIFFERENTIAL/PLATELET
Basophils Absolute: 0.1 10*3/uL (ref 0.0–0.2)
Basos: 1 %
EOS (ABSOLUTE): 0.5 10*3/uL — ABNORMAL HIGH (ref 0.0–0.4)
Eos: 6 %
Hematocrit: 41.2 % (ref 34.0–46.6)
Hemoglobin: 13.6 g/dL (ref 11.1–15.9)
Immature Grans (Abs): 0 10*3/uL (ref 0.0–0.1)
Immature Granulocytes: 1 %
Lymphocytes Absolute: 2.2 10*3/uL (ref 0.7–3.1)
Lymphs: 27 %
MCH: 26.8 pg (ref 26.6–33.0)
MCHC: 33 g/dL (ref 31.5–35.7)
MCV: 81 fL (ref 79–97)
Monocytes Absolute: 0.4 10*3/uL (ref 0.1–0.9)
Monocytes: 5 %
Neutrophils Absolute: 4.8 10*3/uL (ref 1.4–7.0)
Neutrophils: 60 %
Platelets: 376 10*3/uL (ref 150–450)
RBC: 5.08 x10E6/uL (ref 3.77–5.28)
RDW: 13.9 % (ref 11.7–15.4)
WBC: 8 10*3/uL (ref 3.4–10.8)

## 2022-03-11 LAB — COMPREHENSIVE METABOLIC PANEL
ALT: 22 IU/L (ref 0–32)
AST: 20 IU/L (ref 0–40)
Albumin/Globulin Ratio: 2 (ref 1.2–2.2)
Albumin: 4.9 g/dL (ref 4.0–5.0)
Alkaline Phosphatase: 85 IU/L (ref 44–121)
BUN/Creatinine Ratio: 11 (ref 9–23)
BUN: 8 mg/dL (ref 6–20)
Bilirubin Total: <0.2 mg/dL (ref 0.0–1.2)
CO2: 18 mmol/L — ABNORMAL LOW (ref 20–29)
Calcium: 9.7 mg/dL (ref 8.7–10.2)
Chloride: 106 mmol/L (ref 96–106)
Creatinine, Ser: 0.76 mg/dL (ref 0.57–1.00)
Globulin, Total: 2.5 g/dL (ref 1.5–4.5)
Glucose: 87 mg/dL (ref 70–99)
Potassium: 4.3 mmol/L (ref 3.5–5.2)
Sodium: 138 mmol/L (ref 134–144)
Total Protein: 7.4 g/dL (ref 6.0–8.5)
eGFR: 108 mL/min/{1.73_m2} (ref >59)

## 2022-03-12 LAB — URINE CULTURE

## 2022-03-12 IMAGING — CT CT ABD-PELV W/ CM
2 of 4 series · 16 of 46 positions shown, 18 images · IV contrast (omnipaque)
Comparison: None.

CLINICAL DATA: History of rectal bleeding

EXAM:
CT ABDOMEN AND PELVIS WITH CONTRAST
TECHNIQUE: Multidetector CT imaging of the abdomen and pelvis was performed
using the standard protocol following bolus administration of
intravenous contrast.
CONTRAST:  80mL OMNIPAQUE IOHEXOL 350 MG/ML SOLN

[Series 2: axial st · axial · 0.73mm/px · z∈[+868,+1288]mm · 13 of 96 slices shown, 15 images]
[im 6/96  soft-tissue]
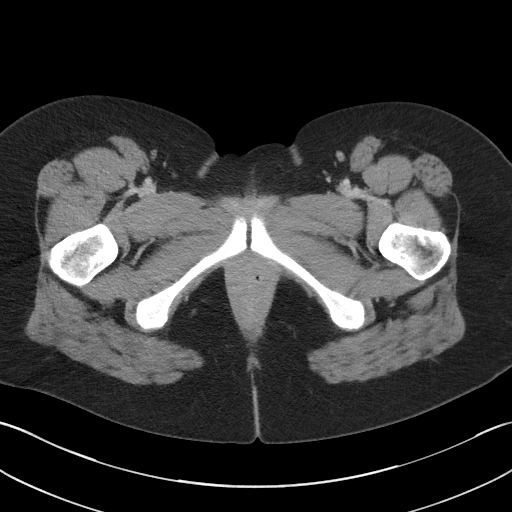
[im 6/96  bone]
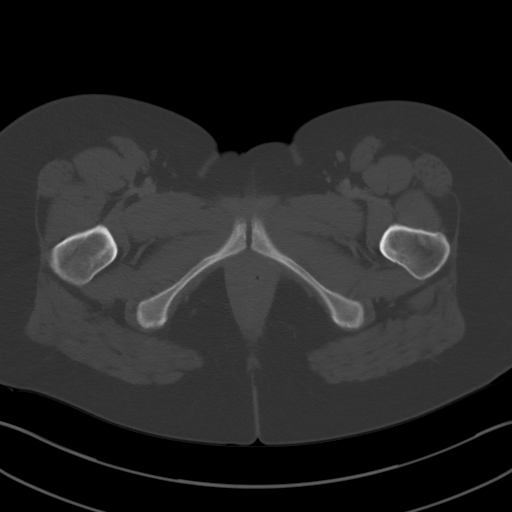
[im 11/96  soft-tissue]
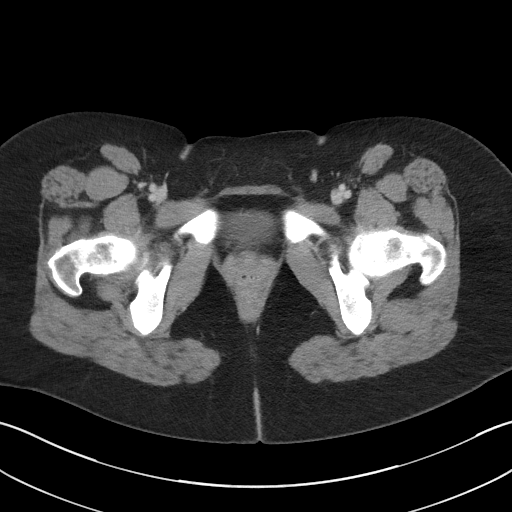
[im 22/96  soft-tissue]
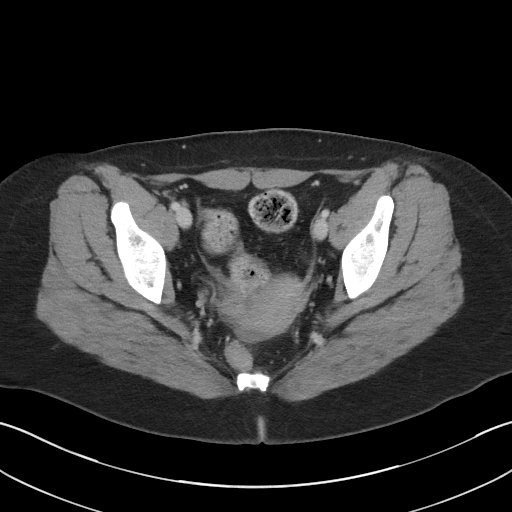
[im 27/96  soft-tissue]
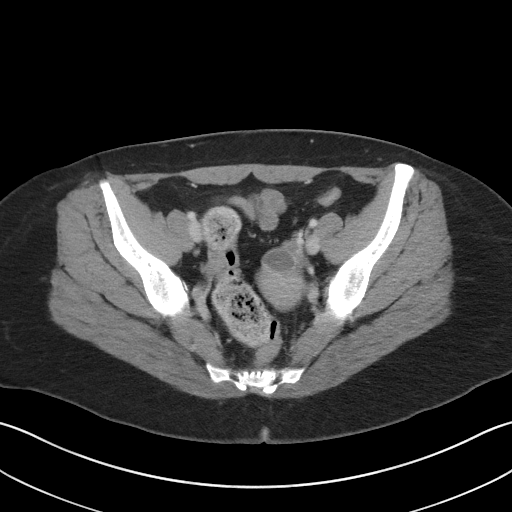
[im 32/96  soft-tissue]
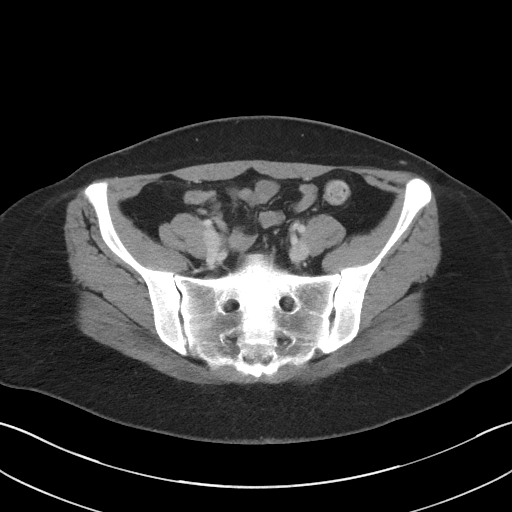
[im 43/96  soft-tissue]
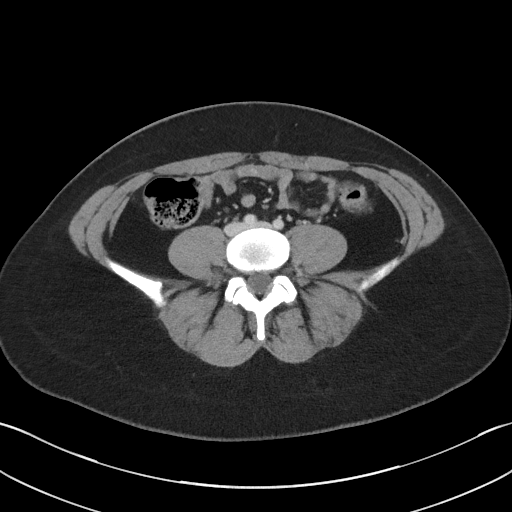
[im 48/96  soft-tissue]
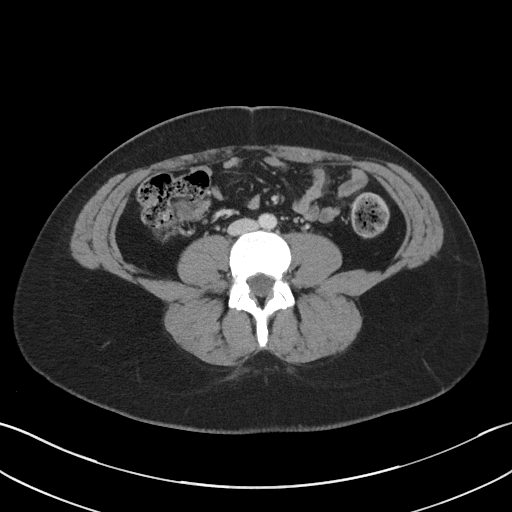
[im 53/96  soft-tissue]
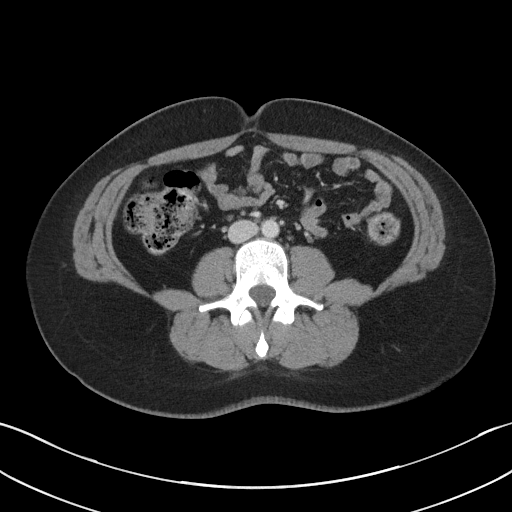
[im 64/96  soft-tissue]
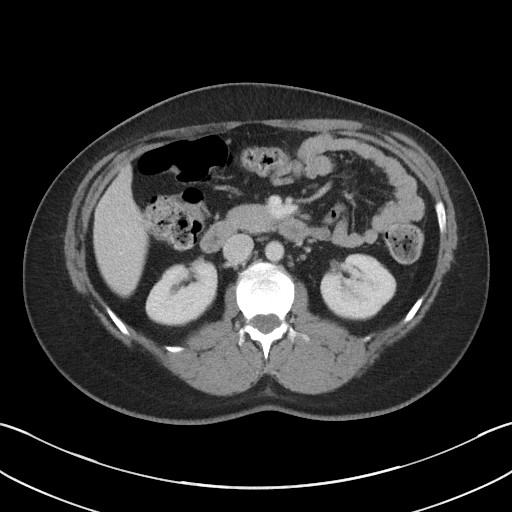
[im 64/96  bone]
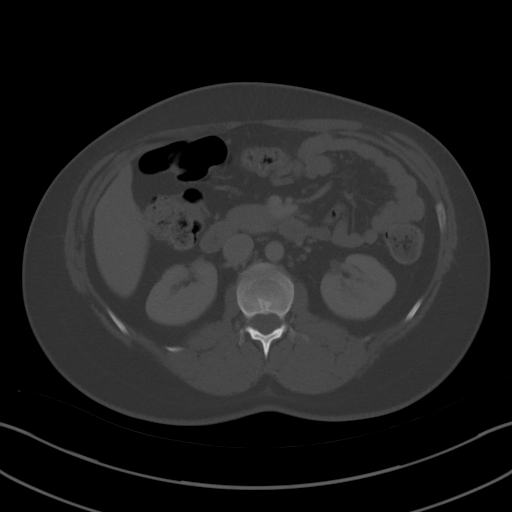
[im 69/96  soft-tissue]
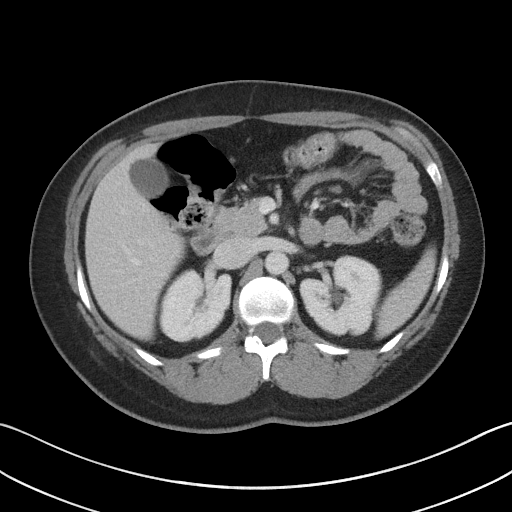
[im 74/96  soft-tissue]
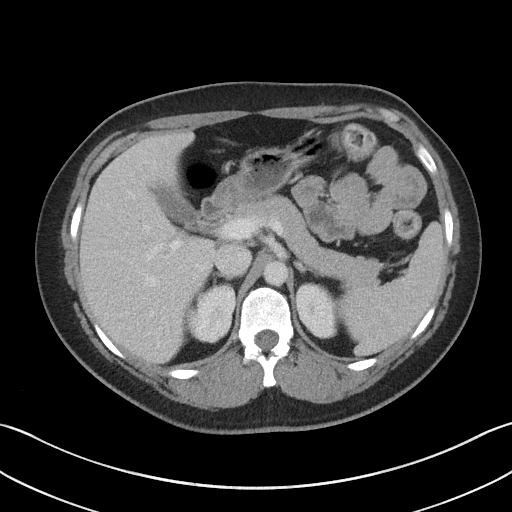
[im 85/96  soft-tissue]
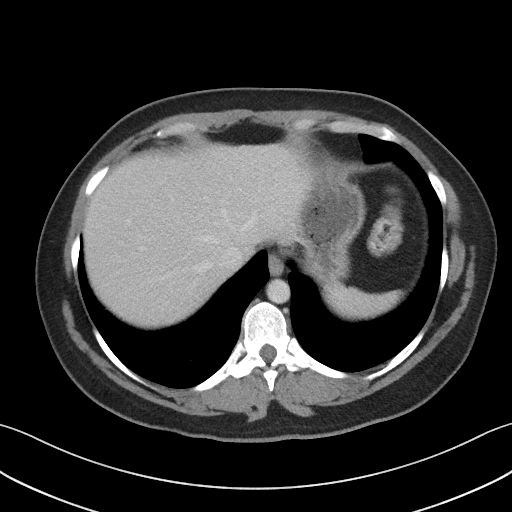
[im 90/96  soft-tissue]
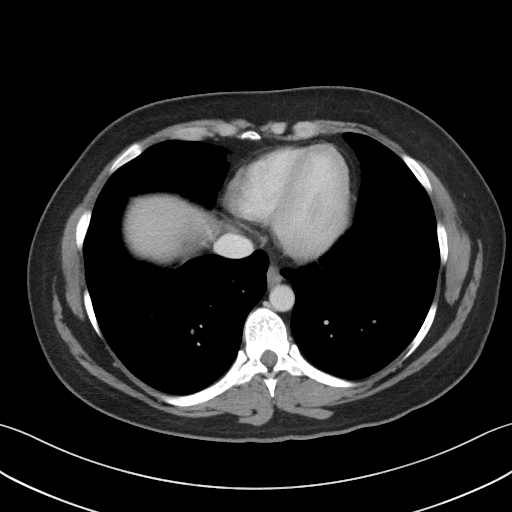

[Series 5: coronal st · coronal · 0.78mm/px · 3 of 83 slices shown]
[im 28/83  soft-tissue]
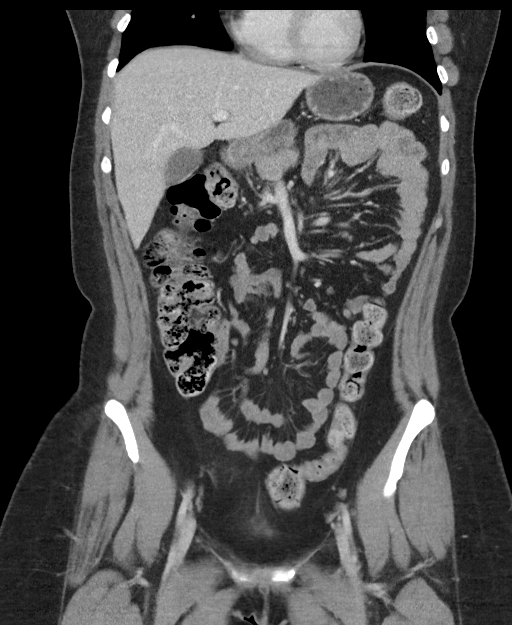
[im 37/83  soft-tissue]
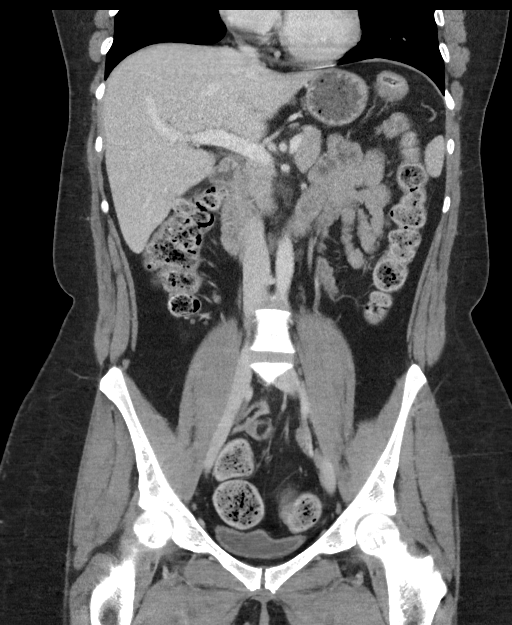
[im 46/83  soft-tissue]
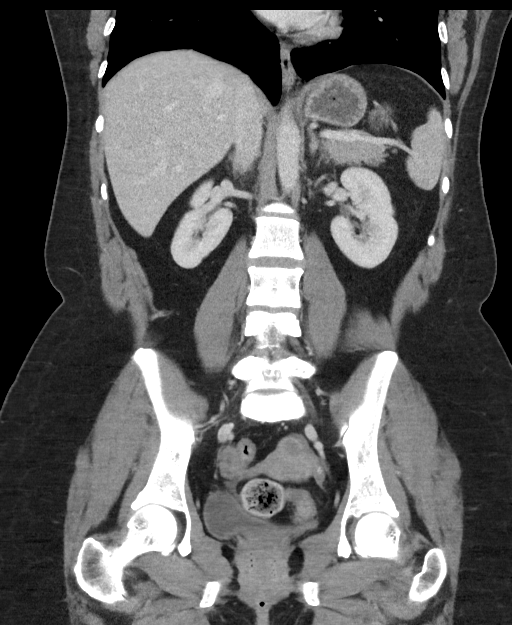

[16 of 46 positions shown; findings below may reference images not displayed]

FINDINGS: Lower chest: No acute abnormality.

Hepatobiliary: No focal liver abnormality is seen. No gallstones,
gallbladder wall thickening, or biliary dilatation.

Pancreas: Unremarkable. No pancreatic ductal dilatation or
surrounding inflammatory changes.

Spleen: Normal in size without focal abnormality.

Adrenals/Urinary Tract: Adrenal glands are unremarkable. Kidneys are
normal, without renal calculi, focal lesion, or hydronephrosis.
Bladder is decompressed.

Stomach/Bowel: Stomach is within normal limits. Appendix appears
normal. No evidence of bowel wall thickening, distention, or
inflammatory changes.

Vascular/Lymphatic: No significant vascular findings are present. No
enlarged abdominal or pelvic lymph nodes.

Reproductive: Normal appearance of the uterus. Left adnexal cyst,
measuring up to 2.3 cm, likely physiologic.

Other: No abdominal wall hernia or abnormality. No abdominopelvic
ascites.

Musculoskeletal: No acute or significant osseous findings.
IMPRESSION: No acute CT findings in the abdomen or pelvis to explain abdominal
pain.

## 2022-03-14 DIAGNOSIS — F3181 Bipolar II disorder: Secondary | ICD-10-CM | POA: Diagnosis not present

## 2022-03-14 DIAGNOSIS — F4310 Post-traumatic stress disorder, unspecified: Secondary | ICD-10-CM | POA: Diagnosis not present

## 2022-03-14 DIAGNOSIS — F419 Anxiety disorder, unspecified: Secondary | ICD-10-CM | POA: Diagnosis not present

## 2022-05-15 ENCOUNTER — Encounter: Payer: Self-pay | Admitting: *Deleted

## 2022-08-07 ENCOUNTER — Ambulatory Visit
Admission: EM | Admit: 2022-08-07 | Discharge: 2022-08-07 | Disposition: A | Discharge status: Home / Self Care | Payer: BLUE CROSS/BLUE SHIELD | Attending: Urgent Care | Admitting: Urgent Care

## 2022-08-07 DIAGNOSIS — B9689 Other specified bacterial agents as the cause of diseases classified elsewhere: Secondary | ICD-10-CM | POA: Insufficient documentation

## 2022-08-07 DIAGNOSIS — F1990 Other psychoactive substance use, unspecified, uncomplicated: Secondary | ICD-10-CM | POA: Diagnosis not present

## 2022-08-07 DIAGNOSIS — E282 Polycystic ovarian syndrome: Secondary | ICD-10-CM | POA: Insufficient documentation

## 2022-08-07 DIAGNOSIS — N939 Abnormal uterine and vaginal bleeding, unspecified: Secondary | ICD-10-CM | POA: Insufficient documentation

## 2022-08-07 DIAGNOSIS — Z113 Encounter for screening for infections with a predominantly sexual mode of transmission: Secondary | ICD-10-CM | POA: Insufficient documentation

## 2022-08-07 DIAGNOSIS — N809 Endometriosis, unspecified: Secondary | ICD-10-CM | POA: Insufficient documentation

## 2022-08-07 DIAGNOSIS — R102 Pelvic and perineal pain: Secondary | ICD-10-CM | POA: Insufficient documentation

## 2022-08-07 DIAGNOSIS — N3001 Acute cystitis with hematuria: Secondary | ICD-10-CM | POA: Insufficient documentation

## 2022-08-07 DIAGNOSIS — N760 Acute vaginitis: Secondary | ICD-10-CM | POA: Insufficient documentation

## 2022-08-07 LAB — POCT URINALYSIS DIP (MANUAL ENTRY)
Bilirubin, UA: NEGATIVE
Glucose, UA: NEGATIVE mg/dL
Ketones, POC UA: NEGATIVE mg/dL
Leukocytes, UA: NEGATIVE
Nitrite, UA: POSITIVE — AB
Protein Ur, POC: NEGATIVE mg/dL
Spec Grav, UA: 1.02 (ref 1.010–1.025)
Urobilinogen, UA: 0.2 E.U./dL
pH, UA: 7 (ref 5.0–8.0)

## 2022-08-07 MED ORDER — KETOROLAC TROMETHAMINE 30 MG/ML IJ SOLN
30.0000 mg | Freq: Once | INTRAMUSCULAR | Status: AC
  Administered 2022-08-07: 30 mg via INTRAMUSCULAR

## 2022-08-07 MED ORDER — CELECOXIB 200 MG PO CAPS: 200.0000 mg | ORAL_CAPSULE | Freq: Two times a day (BID) | ORAL | 5 refills | Status: AC

## 2022-08-07 MED ORDER — CEPHALEXIN 500 MG PO CAPS: 500.0000 mg | ORAL_CAPSULE | Freq: Two times a day (BID) | ORAL | 0 refills | Status: AC

## 2022-08-07 NOTE — ED Triage Notes (Addendum)
Monday night a lot of blood, abdominal cramping since, vomiting and pt has been unable to work and requests provider note.  Endometriosis and PCOS dx. Last period ended 07/30/22. GYN appt next Wednesday. Recovery x 4 years and does not want pain medications.

## 2022-08-07 NOTE — ED Provider Notes (Signed)
[facility]  Note:  This document was prepared using Systems analyst and may include unintentional dictation errors.  MRN: [id number] DOB: 20-Jan-1991  Subjective:   Jaime Leblanc is a 32 y.o. female presenting for 4 day history of acute on chronic abdominal pain, cramping, vaginal bleeding, urinary frequency. Symptoms are moderate to severe.  Has past medical history of endometriosis, PCOS, pelvic pain.  Has an OB/GYN appointment in a week.  Had a laparoscopic procedure done in 2019, no hysterectomy. Has not had good follow up since then. Patient had difficulty with narcotic abuse from prescriptions to manage her pelvic pains. She is now in recovery. Unfortunately, she has been using Tylenol and ibuprofen daily and regularly even past this particular episode. Denies fever, n/v, rashes, dysuria, vaginal discharge.  Has been in a monogamous relationship. Had STI testing done ~6 months ago and was negative. Is not opposed to a recheck here in clinic. She did take a pregnancy test yesterday and was negative. LMP was 07/30/2022, this episode would be irregular vaginal bleeding.   No current facility-administered medications for this encounter.  Current Outpatient Medications:    ARIPiprazole (ABILIFY) 5 MG tablet, Take 7.5 mg by mouth daily., Disp: , Rfl:    ARIPiprazole (ABILIFY) 5 MG tablet, Take by mouth., Disp: , Rfl:    cloNIDine (CATAPRES) 0.1 MG tablet, TAKE 1 TABLET(0.1 MG) BY MOUTH TWICE DAILY, Disp: 180 tablet, Rfl: 0   QUEtiapine (SEROQUEL) 100 MG tablet, TAKE 2 TABLETS(200 MG) BY MOUTH AT BEDTIME, Disp: 60 tablet, Rfl: 0   Allergies  Allergen Reactions   Azithromycin Itching   Lamotrigine Rash   Ondansetron Other (See Comments)    Headaches   Codeine Itching   Levaquin [Levofloxacin] Swelling   Lidocaine Other (See Comments)    Skin reaction   Menthol Itching    Menthol in lotion-pt states this makes her skin feel like it is burning    Topiramate Other (See Comments)    Leg pain   Tramadol Other (See Comments)    Past Medical History:  Diagnosis Date   Anxiety    Depression    Endometriosis 2019   Fibromyalgia    Headache    HPV in female    Hypertension    Narcotic abuse in remission (HCC)    Neuropathy    Left hand and in muscle all over the body   OCD (obsessive compulsive disorder)    Optic neuritis    Left Eye   Panic disorder      Past Surgical History:  Procedure Laterality Date   LAPAROSCOPY  2019   WISDOM TOOTH EXTRACTION      Family History  Problem Relation Age of Onset   Colon cancer Mother 38   Hypertension Mother    Depression Mother    Anxiety disorder Mother    Hypertension Father    Gout Father    Skin cancer Father    Kidney cancer Father    Osteoarthritis Father    Depression Father    Skin cancer Maternal Grandmother    Osteoarthritis Maternal Grandfather    Diabetes Maternal Grandfather    COPD Paternal Grandmother    Heart disease Paternal Grandmother    Heart attack Paternal Grandfather    Hyperlipidemia Paternal Grandfather     Social History   Tobacco Use   Smoking status: Former    Types: Cigarettes    Quit date: 08/15/2010    Years since quitting: 84.9  Smokeless tobacco: Never  Vaping Use   Vaping Use: Every day  Substance Use Topics   Alcohol use: Not Currently   Drug use: No    ROS   Objective:   Vitals: BP 132/83 (BP Location: Right Arm)   Pulse 100   Temp 98.2 F (36.8 C) (Oral)   Resp 18   LMP 07/30/2022 (Exact Date)   SpO2 98%   Physical Exam Constitutional:      General: She is not in acute distress.    Appearance: Normal appearance. She is well-developed. She is not ill-appearing, toxic-appearing or diaphoretic.  HENT:     Head: Normocephalic and atraumatic.     Nose: Nose normal.     Mouth/Throat:     Mouth: Mucous membranes are moist.  Eyes:     General: No scleral icterus.       Right eye: No discharge.        Left eye:  No discharge.     Extraocular Movements: Extraocular movements intact.     Conjunctiva/sclera: Conjunctivae normal.  Cardiovascular:     Rate and Rhythm: Normal rate.  Pulmonary:     Effort: Pulmonary effort is normal.  Abdominal:     General: Bowel sounds are normal. There is no distension.     Palpations: Abdomen is soft. There is no mass.     Tenderness: There is abdominal tenderness in the right lower quadrant, periumbilical area, suprapubic area and left lower quadrant. There is no right CVA tenderness, left CVA tenderness, guarding or rebound.  Skin:    General: Skin is warm and dry.  Neurological:     General: No focal deficit present.     Mental Status: She is alert and oriented to person, place, and time.  Psychiatric:        Mood and Affect: Mood normal.        Behavior: Behavior normal.        Thought Content: Thought content normal.        Judgment: Judgment normal.    Results for orders placed or performed during the hospital encounter of 08/07/22 (from the past 24 hour(s))  POCT urinalysis dipstick     Status: Abnormal   Collection Time: 08/07/22  4:04 PM  Result Value Ref Range   Color, UA yellow yellow   Clarity, UA cloudy (A) clear   Glucose, UA negative negative mg/dL   Bilirubin, UA negative negative   Ketones, POC UA negative negative mg/dL   Spec Grav, UA 1.020 1.010 - 1.025   Blood, UA small (A) negative   pH, UA 7.0 5.0 - 8.0   Protein Ur, POC negative negative mg/dL   Urobilinogen, UA 0.2 0.2 or 1.0 E.U./dL   Nitrite, UA Positive (A) Negative   Leukocytes, UA Negative Negative   IM Toradol 30 mg administered in clinic.  Assessment and Plan :   PDMP not reviewed this encounter.  1. Pelvic pain   2. Endometriosis   3. PCOS (polycystic ovarian syndrome)   4. Acute cystitis with hematuria   5. Excessive use of nonsteroidal anti-inflammatory drug (NSAID)     Deferred urine pregnancy test. Discussed differential which is extensive. At this time,  will defer ER visit for CT scan, emergency ultrasound.  Discussed possibility of pelvic pain associated with her endometriosis, PCOS, acute cystitis with hematuria.  Deferred urine pregnancy test as she did this yesterday and was negative.  Counseled differential does include ovarian torsion, ruptured ovarian cyst, ectopic pregnancy, PID.  She was agreeable to STI testing.  Recommended she avoid using NSAIDs like ibuprofen, Motrin and Aleve long-term.  Will switch her to celecoxib.  She is to keep her appointment with the gynecologist. Counseled patient on potential for adverse effects with medications prescribed/recommended today, ER and return-to-clinic precautions discussed, patient verbalized understanding.    Jaynee Eagles, [place] 08/07/22 1614

## 2022-08-07 NOTE — Discharge Instructions (Addendum)
Please start Keflex to address an urinary tract infection. Make sure you hydrate very well with plain water and a quantity of 80 ounces of water a day.  Please limit drinks that are considered urinary irritants such as soda, sweet tea, coffee, energy drinks, alcohol.  These can worsen your urinary and genital symptoms but also be the source of them.  I will let you know about your urine culture and vaginal swab results through MyChart to see if we need to prescribe or change your antibiotics based off of those results.  Make sure you keep your appointment with your gynecologist.

## 2022-08-08 LAB — CERVICOVAGINAL ANCILLARY ONLY
Bacterial Vaginitis (gardnerella): POSITIVE — AB
Chlamydia: NEGATIVE
Comment: NEGATIVE
Comment: NEGATIVE
Comment: NEGATIVE
Comment: NORMAL
Neisseria Gonorrhea: NEGATIVE
Trichomonas: NEGATIVE

## 2022-08-11 ENCOUNTER — Telehealth (HOSPITAL_COMMUNITY): Payer: Self-pay | Admitting: Emergency Medicine

## 2022-08-11 MED ORDER — METRONIDAZOLE 500 MG PO TABS: 500.0000 mg | ORAL_TABLET | Freq: Two times a day (BID) | ORAL | 0 refills | Status: AC
